# Patient Record
Sex: Female | Born: 1995 | Race: White | Hispanic: No | Marital: Married | Smoking: Former smoker
Health system: Southern US, Community
[De-identification: ages and names within clinical notes are randomized; demographics above are authoritative.]

## PROBLEM LIST (undated history)

## (undated) DIAGNOSIS — O24419 Gestational diabetes mellitus in pregnancy, unspecified control: Secondary | ICD-10-CM

## (undated) DIAGNOSIS — R87629 Unspecified abnormal cytological findings in specimens from vagina: Secondary | ICD-10-CM

## (undated) DIAGNOSIS — F329 Major depressive disorder, single episode, unspecified: Secondary | ICD-10-CM

## (undated) DIAGNOSIS — R519 Headache, unspecified: Secondary | ICD-10-CM

## (undated) DIAGNOSIS — O139 Gestational [pregnancy-induced] hypertension without significant proteinuria, unspecified trimester: Secondary | ICD-10-CM

## (undated) DIAGNOSIS — D649 Anemia, unspecified: Secondary | ICD-10-CM

## (undated) DIAGNOSIS — E119 Type 2 diabetes mellitus without complications: Secondary | ICD-10-CM

## (undated) DIAGNOSIS — F32A Depression, unspecified: Secondary | ICD-10-CM

## (undated) DIAGNOSIS — O1490 Unspecified pre-eclampsia, unspecified trimester: Secondary | ICD-10-CM

## (undated) HISTORY — PX: NO PAST SURGERIES: SHX2092

## (undated) HISTORY — DX: Depression, unspecified: F32.A

## (undated) HISTORY — DX: Major depressive disorder, single episode, unspecified: F32.9

## (undated) HISTORY — DX: Gestational diabetes mellitus in pregnancy, unspecified control: O24.419

---

## 2017-06-20 NOTE — L&D Delivery Note (Signed)
Delivery Note  First Stage: Induction of labor: 04/23/18 @ 9pm with Cytotec, then Intracervical balloon Labor onset: 04/24/18 @ 1745 Augmentation : Pitocin and AROM Analgesia /Anesthesia intrapartum: IV Stadol then epidural  AROM at 1736 for clear fluid   Second Stage: Complete dilation at 2035 Onset of pushing at 2035 FHR second stage Cat 2  Delivery of a healthy, viable female "Achilles Dunk" at 2056 by Carlean Jews, CNM in LOA position with restitution to LOT. Umbilical cord noted at upper fetal chest/neck at time of delivery.   No nuchal cord Cord double clamped after cessation of pulsation, cut by FOB Cord blood sample collected   Third Stage: Placenta delivered via Tomasa Blase intact with 3 VC @ 2104 Placenta disposition: hospital disposal  Uterine tone firm with massage / bleeding brisk immediately after placenta separation, but slowed with massage, IV Pitocin bolus .  There was a continuous trickle and Cytotec was given rectally. Cervix was evaluated for lacerations and a gentle bimanual exam performed to clear clots from lower uterine segment. No cervical lacerations identified. Trickle then resolved.   Small 1st degree perineal laceration identified  Anesthesia for repair: epidural  Repair: 3.0 vicryl repaired in standard fashion  Est. Blood Loss (mL):  Complications: postpartum hemorrhage   Mom to postpartum.  Baby to Couplet care / Skin to Skin.  Newborn: Birth Weight: pending  Apgar Scores: 9, 9 Feeding planned: Breast  Carlean Jews, MSN, CNM [facility]

## 2018-03-15 LAB — OB RESULTS CONSOLE HEPATITIS B SURFACE ANTIGEN: Hepatitis B Surface Ag: NEGATIVE

## 2018-03-15 LAB — OB RESULTS CONSOLE HIV ANTIBODY (ROUTINE TESTING): HIV: NONREACTIVE

## 2018-03-15 LAB — OB RESULTS CONSOLE GC/CHLAMYDIA
Chlamydia: NEGATIVE
GC PROBE AMP, GENITAL: NEGATIVE

## 2018-03-15 LAB — OB RESULTS CONSOLE RUBELLA ANTIBODY, IGM: Rubella: IMMUNE

## 2018-03-15 LAB — OB RESULTS CONSOLE ABO/RH: RH TYPE: POSITIVE

## 2018-03-15 LAB — OB RESULTS CONSOLE ANTIBODY SCREEN: ANTIBODY SCREEN: NEGATIVE

## 2018-03-15 LAB — OB RESULTS CONSOLE RPR: RPR: NONREACTIVE

## 2018-04-02 LAB — OB RESULTS CONSOLE GBS: STREP GROUP B AG: NEGATIVE

## 2018-04-20 ENCOUNTER — Encounter (HOSPITAL_COMMUNITY): Payer: Self-pay | Admitting: *Deleted

## 2018-04-20 ENCOUNTER — Telehealth (HOSPITAL_COMMUNITY): Payer: Self-pay | Admitting: *Deleted

## 2018-04-20 ENCOUNTER — Inpatient Hospital Stay (EMERGENCY_DEPARTMENT_HOSPITAL)
Admission: AD | Admit: 2018-04-20 | Discharge: 2018-04-20 | Disposition: A | Discharge status: Home / Self Care | Payer: BLUE CROSS/BLUE SHIELD | Source: Ambulatory Visit | Attending: Obstetrics and Gynecology | Admitting: Obstetrics and Gynecology

## 2018-04-20 DIAGNOSIS — O24429 Gestational diabetes mellitus in childbirth, unspecified control: Secondary | ICD-10-CM

## 2018-04-20 DIAGNOSIS — Z3A38 38 weeks gestation of pregnancy: Secondary | ICD-10-CM

## 2018-04-20 DIAGNOSIS — Z3483 Encounter for supervision of other normal pregnancy, third trimester: Secondary | ICD-10-CM

## 2018-04-20 DIAGNOSIS — Z9119 Patient's noncompliance with other medical treatment and regimen: Secondary | ICD-10-CM

## 2018-04-20 DIAGNOSIS — D62 Acute posthemorrhagic anemia: Secondary | ICD-10-CM | POA: Diagnosis not present

## 2018-04-20 DIAGNOSIS — Z3689 Encounter for other specified antenatal screening: Secondary | ICD-10-CM

## 2018-04-20 DIAGNOSIS — O9081 Anemia of the puerperium: Secondary | ICD-10-CM | POA: Diagnosis not present

## 2018-04-20 DIAGNOSIS — O26893 Other specified pregnancy related conditions, third trimester: Secondary | ICD-10-CM | POA: Diagnosis present

## 2018-04-20 DIAGNOSIS — Z87891 Personal history of nicotine dependence: Secondary | ICD-10-CM

## 2018-04-20 DIAGNOSIS — O24425 Gestational diabetes mellitus in childbirth, controlled by oral hypoglycemic drugs: Secondary | ICD-10-CM | POA: Diagnosis present

## 2018-04-20 DIAGNOSIS — R160 Hepatomegaly, not elsewhere classified: Secondary | ICD-10-CM | POA: Diagnosis present

## 2018-04-20 NOTE — MAU Note (Signed)
Patient sent over from office.  States the baby had "some dips in the heart rate" and that they wanted her to be monitored.  She reports she has been having some CTX and was 1cm dilated in the office today.

## 2018-04-20 NOTE — MAU Provider Note (Signed)
History     CSN: [id number]  Arrival date and time: 04/20/18 1136   First Provider Initiated Contact with Patient 04/20/18 1248      Chief Complaint  Patient presents with  . Non-stress Test   HPI Amber Daniel is a 22 y.o. G3P1002 at [redacted]w[redacted]d who presents for monitoring from the office. She states she was in the office for an NST due to gestational diabetes and said "the baby was having dips in its heart rate." She denies any vaginal bleeding or leaking of fluid. Reports contractions and was 1cm in the office today. Reports normal fetal movement. She is scheduled for induction on 11/5.  OB History    Gravida  3   Para  2   Term  1   Preterm  0   AB  0   Living  2     SAB  0   TAB  0   Ectopic  0   Multiple  0   Live Births  2           Past Medical History:  Diagnosis Date  . Depression    major in high school    History reviewed. No pertinent surgical history.  Family History  Problem Relation Age of Onset  . Diabetes Father   . Hypertension Father   . Hypertension Paternal Grandfather   . Heart disease Paternal Grandfather     Social History   Tobacco Use  . Smoking status: Former Games developer  . Smokeless tobacco: Never Used  Substance Use Topics  . Alcohol use: Not Currently    Comment: not in pregnancy  . Drug use: Yes    Types: Marijuana    Comment: not since 22 years old    Allergies: No Known Allergies  Medications Prior to Admission  Medication Sig Dispense Refill Last Dose  . EVENING PRIMROSE OIL PO Take 1 capsule by mouth daily.   04/19/2018 at Unknown time  . ferrous sulfate 325 (65 FE) MG tablet Take 325 mg by mouth 2 (two) times daily with a meal.   Past Week at Unknown time  . glyBURIDE (DIABETA) 2.5 MG tablet TK 1 T PO QD WITH DINNER  0 04/19/2018 at Unknown time  . metFORMIN (GLUCOPHAGE) 500 MG tablet TK 1/2 T PO BID AFTER BREAKFAST AND SUPPER  0 04/19/2018 at Unknown time  . Prenatal Vit-Fe Fumarate-FA (PRENATAL  MULTIVITAMIN) TABS tablet Take 1 tablet by mouth daily at 12 noon.   Past Week at Unknown time    Review of Systems  Constitutional: Negative.  Negative for fatigue and fever.  HENT: Negative.   Respiratory: Negative.  Negative for shortness of breath.   Cardiovascular: Negative.  Negative for chest pain.  Gastrointestinal: Negative.  Negative for abdominal pain, constipation, diarrhea, nausea and vomiting.  Genitourinary: Negative.  Negative for dysuria, vaginal bleeding and vaginal discharge.  Neurological: Negative.  Negative for dizziness and headaches.   Physical Exam   Blood pressure 124/77, pulse (!) 103, temperature 98.1 F (36.7 C), temperature source Oral, resp. rate 18, weight 71.7 kg.  Physical Exam  Nursing note and vitals reviewed. Constitutional: She is oriented to person, place, and time. She appears well-developed and well-nourished. No distress.  HENT:  Head: Normocephalic.  Eyes: Pupils are equal, round, and reactive to light.  Cardiovascular: Normal rate, regular rhythm and normal heart sounds.  Respiratory: Effort normal and breath sounds normal. No respiratory distress.  GI: Soft. Bowel sounds are normal. She exhibits no distension.  There is no tenderness.  Neurological: She is alert and oriented to person, place, and time.  Skin: Skin is warm and dry.  Psychiatric: She has a normal mood and affect. Her behavior is normal. Judgment and thought content normal.   Fetal Tracing:  Baseline: 140 Variability: moderate Accels: 15x15 Decels: 1 variable lasting 10 sec  Toco: every 2-8 minutes  MAU Course  Procedures  MDM NST- reactive, one variable  Offered to check cervix due to contraction pattern and patient declined.  Consulted with Dr. Debroah Loop to review FHR tracing- ok to discharge patient home to keep scheduled induction on Tuesday.  Assessment and Plan   1. Non-stress test reactive   2. [redacted] weeks gestation of pregnancy    -Discharge home in  stable condition -Fetal kick count precautions discussed -Patient advised to follow-up with [facility] on Tuesday for induction. -Patient may return to MAU as needed or if her condition were to change or worsen   Rolm Bookbinder CNM 04/20/2018, 12:54 PM

## 2018-04-20 NOTE — Telephone Encounter (Signed)
Preadmission screen  

## 2018-04-20 NOTE — Discharge Instructions (Signed)
Braxton Hicks Contractions °Contractions of the uterus can occur throughout pregnancy, but they are not always a sign that you are in labor. You may have practice contractions called Braxton Hicks contractions. These false labor contractions are sometimes confused with true labor. °What are Braxton Hicks contractions? °Braxton Hicks contractions are tightening movements that occur in the muscles of the uterus before labor. Unlike true labor contractions, these contractions do not result in opening (dilation) and thinning of the cervix. Toward the end of pregnancy (32-34 weeks), Braxton Hicks contractions can happen more often and may become stronger. These contractions are sometimes difficult to tell apart from true labor because they can be very uncomfortable. You should not feel embarrassed if you go to the hospital with false labor. °Sometimes, the only way to tell if you are in true labor is for your health care provider to look for changes in the cervix. The health care provider will do a physical exam and may monitor your contractions. If you are not in true labor, the exam should show that your cervix is not dilating and your water has not broken. °If there are other health problems associated with your pregnancy, it is completely safe for you to be sent home with false labor. You may continue to have Braxton Hicks contractions until you go into true labor. °How to tell the difference between true labor and false labor °True labor °· Contractions last 30-70 seconds. °· Contractions become very regular. °· Discomfort is usually felt in the top of the uterus, and it spreads to the lower abdomen and low back. °· Contractions do not go away with walking. °· Contractions usually become more intense and increase in frequency. °· The cervix dilates and gets thinner. °False labor °· Contractions are usually shorter and not as strong as true labor contractions. °· Contractions are usually irregular. °· Contractions  are often felt in the front of the lower abdomen and in the groin. °· Contractions may go away when you walk around or change positions while lying down. °· Contractions get weaker and are shorter-lasting as time goes on. °· The cervix usually does not dilate or become thin. °Follow these instructions at home: °· Take over-the-counter and prescription medicines only as told by your health care provider. °· Keep up with your usual exercises and follow other instructions from your health care provider. °· Eat and drink lightly if you think you are going into labor. °· If Braxton Hicks contractions are making you uncomfortable: °? Change your position from lying down or resting to walking, or change from walking to resting. °? Sit and rest in a tub of warm water. °? Drink enough fluid to keep your urine pale yellow. Dehydration may cause these contractions. °? Do slow and deep breathing several times an hour. °· Keep all follow-up prenatal visits as told by your health care provider. This is important. °Contact a health care provider if: °· You have a fever. °· You have continuous pain in your abdomen. °Get help right away if: °· Your contractions become stronger, more regular, and closer together. °· You have fluid leaking or gushing from your vagina. °· You pass blood-tinged mucus (bloody show). °· You have bleeding from your vagina. °· You have low back pain that you never had before. °· You feel your baby’s head pushing down and causing pelvic pressure. °· Your baby is not moving inside you as much as it used to. °Summary °· Contractions that occur before labor are called Braxton   Hicks contractions, false labor, or practice contractions. °· Braxton Hicks contractions are usually shorter, weaker, farther apart, and less regular than true labor contractions. True labor contractions usually become progressively stronger and regular and they become more frequent. °· Manage discomfort from Braxton Hicks contractions by  changing position, resting in a warm bath, drinking plenty of water, or practicing deep breathing. °This information is not intended to replace advice given to you by your health care provider. Make sure you discuss any questions you have with your health care provider. °Document Released: 10/20/2016 Document Revised: 10/20/2016 Document Reviewed: 10/20/2016 °Elsevier Interactive Patient Education © 2018 Else[employer] Inc. ° °

## 2018-04-23 ENCOUNTER — Inpatient Hospital Stay (HOSPITAL_COMMUNITY)
Admission: AD | Admit: 2018-04-23 | Discharge: 2018-04-26 | DRG: 806 | Disposition: A | Discharge status: Home / Self Care | Payer: BLUE CROSS/BLUE SHIELD | Source: Ambulatory Visit | Attending: Obstetrics and Gynecology | Admitting: Obstetrics and Gynecology

## 2018-04-23 ENCOUNTER — Encounter (HOSPITAL_COMMUNITY): Payer: Self-pay | Admitting: *Deleted

## 2018-04-23 DIAGNOSIS — Z87891 Personal history of nicotine dependence: Secondary | ICD-10-CM | POA: Diagnosis not present

## 2018-04-23 DIAGNOSIS — Z3490 Encounter for supervision of normal pregnancy, unspecified, unspecified trimester: Secondary | ICD-10-CM | POA: Diagnosis present

## 2018-04-23 DIAGNOSIS — Z3A38 38 weeks gestation of pregnancy: Secondary | ICD-10-CM | POA: Diagnosis not present

## 2018-04-23 DIAGNOSIS — O24425 Gestational diabetes mellitus in childbirth, controlled by oral hypoglycemic drugs: Secondary | ICD-10-CM | POA: Diagnosis present

## 2018-04-23 DIAGNOSIS — O24419 Gestational diabetes mellitus in pregnancy, unspecified control: Secondary | ICD-10-CM

## 2018-04-23 DIAGNOSIS — O26893 Other specified pregnancy related conditions, third trimester: Secondary | ICD-10-CM | POA: Diagnosis present

## 2018-04-23 DIAGNOSIS — Z9119 Patient's noncompliance with other medical treatment and regimen: Secondary | ICD-10-CM | POA: Diagnosis not present

## 2018-04-23 DIAGNOSIS — D62 Acute posthemorrhagic anemia: Secondary | ICD-10-CM | POA: Diagnosis not present

## 2018-04-23 DIAGNOSIS — R160 Hepatomegaly, not elsewhere classified: Secondary | ICD-10-CM | POA: Diagnosis present

## 2018-04-23 DIAGNOSIS — O368390 Maternal care for abnormalities of the fetal heart rate or rhythm, unspecified trimester, not applicable or unspecified: Secondary | ICD-10-CM | POA: Diagnosis present

## 2018-04-23 DIAGNOSIS — O9081 Anemia of the puerperium: Secondary | ICD-10-CM | POA: Diagnosis not present

## 2018-04-23 HISTORY — DX: Anemia, unspecified: D64.9

## 2018-04-23 HISTORY — DX: Type 2 diabetes mellitus without complications: E11.9

## 2018-04-23 LAB — TYPE AND SCREEN
ABO/RH(D): O POS
Antibody Screen: NEGATIVE

## 2018-04-23 LAB — CBC
HCT: 31.9 % — ABNORMAL LOW (ref 36.0–46.0)
Hemoglobin: 10.2 g/dL — ABNORMAL LOW (ref 12.0–15.0)
MCH: 28 pg (ref 26.0–34.0)
MCHC: 32 g/dL (ref 30.0–36.0)
MCV: 87.6 fL (ref 80.0–100.0)
NRBC: 0 % (ref 0.0–0.2)
PLATELETS: 247 10*3/uL (ref 150–400)
RBC: 3.64 MIL/uL — ABNORMAL LOW (ref 3.87–5.11)
RDW: 14.6 % (ref 11.5–15.5)
WBC: 11.4 10*3/uL — ABNORMAL HIGH (ref 4.0–10.5)

## 2018-04-23 LAB — GLUCOSE, CAPILLARY: Glucose-Capillary: 112 mg/dL — ABNORMAL HIGH (ref 70–99)

## 2018-04-23 LAB — ABO/RH: ABO/RH(D): O POS

## 2018-04-23 MED ORDER — LIDOCAINE HCL (PF) 1 % IJ SOLN
30.0000 mL | INTRAMUSCULAR | Status: DC | PRN
  Filled 2018-04-23: qty 30

## 2018-04-23 MED ORDER — SODIUM CHLORIDE 0.9 % IV SOLN: 250.0000 mL | INTRAVENOUS | Status: DC | PRN

## 2018-04-23 MED ORDER — ACETAMINOPHEN 325 MG PO TABS: 650.0000 mg | ORAL_TABLET | ORAL | Status: DC | PRN

## 2018-04-23 MED ORDER — TERBUTALINE SULFATE 1 MG/ML IJ SOLN
0.2500 mg | Freq: Once | INTRAMUSCULAR | Status: DC | PRN
  Filled 2018-04-23: qty 1

## 2018-04-23 MED ORDER — ONDANSETRON HCL 4 MG/2ML IJ SOLN
4.0000 mg | Freq: Four times a day (QID) | INTRAMUSCULAR | Status: DC | PRN
  Administered 2018-04-24: 4 mg via INTRAVENOUS
  Filled 2018-04-23: qty 2

## 2018-04-23 MED ORDER — MISOPROSTOL 50MCG HALF TABLET
50.0000 ug | ORAL_TABLET | ORAL | Status: DC
  Administered 2018-04-23 – 2018-04-24 (×3): 50 ug via BUCCAL
  Filled 2018-04-23 (×3): qty 1

## 2018-04-23 MED ORDER — LACTATED RINGERS IV SOLN: 500.0000 mL | INTRAVENOUS | Status: DC | PRN

## 2018-04-23 MED ORDER — SOD CITRATE-CITRIC ACID 500-334 MG/5ML PO SOLN: 30.0000 mL | ORAL | Status: DC | PRN

## 2018-04-23 MED ORDER — SODIUM CHLORIDE 0.9% FLUSH: 3.0000 mL | INTRAVENOUS | Status: DC | PRN

## 2018-04-23 MED ORDER — OXYTOCIN BOLUS FROM INFUSION
500.0000 mL | Freq: Once | INTRAVENOUS | Status: AC
  Administered 2018-04-24: 500 mL via INTRAVENOUS

## 2018-04-23 MED ORDER — OXYTOCIN 40 UNITS IN LACTATED RINGERS INFUSION - SIMPLE MED
2.5000 [IU]/h | INTRAVENOUS | Status: DC
  Filled 2018-04-23: qty 1000

## 2018-04-23 MED ORDER — SODIUM CHLORIDE 0.9% FLUSH: 3.0000 mL | Freq: Two times a day (BID) | INTRAVENOUS | Status: DC

## 2018-04-23 NOTE — H&P (Signed)
OB ADMISSION/ HISTORY & PHYSICAL:  Admission Date: 04/23/2018  6:41 PM  Admit Diagnosis: Induction of labor at 38+6 weeks for variable decelerations and uncontrolled A2 GDM  Amber Daniel is a 22 y.o. female G3P2 at 38+6 weeks presenting for induction of labor for variable decelerations in the office on NST and uncontrolled A2 gestational diabetes. She had 2 variable decelerations on NST at the office today- lasting 1 minute to nadir 100bpm.  Her BPP was 8/8. She also has uncontrolled A2 gestational diabetes and is non-compliant with taking Metformin 500mg  BID and Glyburide 2.5mg .  She states she last took her medication on Friday and did not check blood sugars regularly over the weekend.   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 Of Benign liver mass - needs to see GI PP  - Yeast vaginitis  - Iron deficiency anemia  - Major Depression in high school; not on meds   Prenatal Labs: ABO, Rh: --/--/O POS (11/04 1913) Antibody: NEG (11/04 1913) Rubella: Immune (09/26 0000)  RPR: Nonreactive (09/26 0000)  HBsAg: Negative (09/26 0000)  HIV: Non-reactive (09/26 0000)  GBS: Negative (10/14 0000)  Declined genetic screening  CUS - normal female anatomy, incomplete views of heart d/t advanced gestational age, suboptimal views of placental cord insertion Declines Flu and Tdap   Medical / Surgical History :  Past medical history:  Past Medical History:   Diagnosis Date  . Anemia   . Depression    major in high school  . Diabetes mellitus without complication [facility])      Past surgical history: History reviewed. No pertinent surgical history.  Family History:  Family History  Problem Relation Age of Onset  . Diabetes Father   . Hypertension Father   . Hypertension Paternal Grandfather   . Heart disease Paternal Grandfather      Social History:  reports that she has quit smoking. She has never used smokeless tobacco. She reports that she drank alcohol. She reports that she has current or past drug history. Drug: Marijuana.   Allergies: Patient has no known allergies.    Current Medications at time of admission:  Prior to Admission medications   Medication Sig Start Date End Date Taking? Authorizing Provider  ferrous sulfate 325 (65 FE) MG tablet Take 325 mg by mouth 2 (two) times daily with a meal.   Yes [provider]  glyBURIDE (DIABETA) 2.5 MG tablet TK 1 T PO QD WITH DINNER 04/16/18  Yes [provider]  metFORMIN (GLUCOPHAGE) 500 MG tablet TK 1/2 T PO BID AFTER BREAKFAST AND SUPPER 04/11/18  Yes [provider]  Prenatal Vit-Fe Fumarate-FA (PRENATAL MULTIVITAMIN) TABS tablet Take 1 tablet by mouth daily at 12 noon.   Yes [provider]  EVENING PRIMROSE OIL PO Take 1 capsule by mouth daily.    [provider]     Review of Systems: Active FM Irregular painful contractions for several days  No LOF  / SROM  No bloody show   Physical Exam:  VS: Blood pressure 116/68, pulse (!) 105, temperature 98.1 F (36.7 C), temperature source Oral, resp. rate 16.  General: alert and oriented, appears calm Heart: RRR Lungs: Clear lung fields Abdomen: Gravid, soft and non-tender, non-distended / uterus: gravid, non-tender Extremities: no edema  Genitalia / VE: Dilation: Fingertip Effacement (%): 50 Exam by::  , CNM  FHR: baseline rate 140 bpm / variability  moderate / accelerations + 15x15 / no decelerations TOCO: irregular   Assessment: 38+[redacted] weeks gestation Induction stage of labor FHR category 1 Variable decelerations in office, now resolved Uncontrolled, non-compliant A2 GDM  Hx. Of PPH   Plan:  1. Admit to [employer]   - Routine labor and delivery orders   - Pain management: nitrous oxide; epidural PRN   - Saline lock PIV    - Light laboring gestational carb modified diet    - Induction method: Cytotec buccal every 4 hours    - Continuous fetal monitoring   - Check CBGs every 4 hours  2. GBS Negative 3. Postpartum:   - Breast feeding 4. Anticipate MOD: NSVD   - Proven pelvis: 8#1oz   Dr. Billy Coast notified of admission / plan of care  Carlean Jews, MSN, [facility]

## 2018-04-23 NOTE — Anesthesia Pain Management Evaluation Note (Signed)
  CRNA Pain Management Visit Note  Patient: Amber Daniel, 22 y.o., female  "Hello I am a member of the anesthesia team at [facility]. We have an anesthesia team available at all times to provide care throughout the hospital, including epidural management and anesthesia for C-section. I don't know your plan for the delivery whether it a natural birth, water birth, IV sedation, nitrous supplementation, doula or epidural, but we want to meet your pain goals."   1.Was your pain managed to your expectations on prior hospitalizations?   Yes   2.What is your expectation for pain management during this hospitalization?     Labor support without medications, Epidural and IV pain meds  3.How can we help you reach that goal? flexible  Record the patient's initial score and the patient's pain goal.   Pain: 4  Pain Goal: 6 [facility] wants you to be able to say your pain was always managed very well.  Deja Kaigler 04/23/2018

## 2018-04-24 ENCOUNTER — Encounter (HOSPITAL_COMMUNITY): Payer: Self-pay

## 2018-04-24 ENCOUNTER — Inpatient Hospital Stay (HOSPITAL_COMMUNITY): Payer: BLUE CROSS/BLUE SHIELD | Admitting: Anesthesiology

## 2018-04-24 DIAGNOSIS — O24419 Gestational diabetes mellitus in pregnancy, unspecified control: Secondary | ICD-10-CM

## 2018-04-24 DIAGNOSIS — Z3490 Encounter for supervision of normal pregnancy, unspecified, unspecified trimester: Secondary | ICD-10-CM | POA: Diagnosis present

## 2018-04-24 LAB — GLUCOSE, CAPILLARY
GLUCOSE-CAPILLARY: 81 mg/dL (ref 70–99)
GLUCOSE-CAPILLARY: 90 mg/dL (ref 70–99)
GLUCOSE-CAPILLARY: 80 mg/dL (ref 70–99)
Glucose-Capillary: 83 mg/dL (ref 70–99)
Glucose-Capillary: 74 mg/dL (ref 70–99)

## 2018-04-24 LAB — RPR: RPR Ser Ql: NONREACTIVE

## 2018-04-24 MED ORDER — IBUPROFEN 600 MG PO TABS
600.0000 mg | ORAL_TABLET | Freq: Four times a day (QID) | ORAL | Status: DC
  Administered 2018-04-24 – 2018-04-26 (×6): 600 mg via ORAL
  Filled 2018-04-24 (×6): qty 1

## 2018-04-24 MED ORDER — MISOPROSTOL 200 MCG PO TABS
ORAL_TABLET | ORAL | Status: AC
  Administered 2018-04-24: 800 ug
  Filled 2018-04-24: qty 4

## 2018-04-24 MED ORDER — DIPHENHYDRAMINE HCL 50 MG/ML IJ SOLN: 12.5000 mg | INTRAMUSCULAR | Status: DC | PRN

## 2018-04-24 MED ORDER — TERBUTALINE SULFATE 1 MG/ML IJ SOLN
0.2500 mg | Freq: Once | INTRAMUSCULAR | Status: DC | PRN
  Filled 2018-04-24: qty 1

## 2018-04-24 MED ORDER — DIPHENHYDRAMINE HCL 25 MG PO CAPS: 25.0000 mg | ORAL_CAPSULE | Freq: Four times a day (QID) | ORAL | Status: DC | PRN

## 2018-04-24 MED ORDER — BUTORPHANOL TARTRATE 1 MG/ML IJ SOLN
1.0000 mg | INTRAMUSCULAR | Status: DC | PRN
  Administered 2018-04-24 (×4): 1 mg via INTRAVENOUS
  Filled 2018-04-24 (×4): qty 1

## 2018-04-24 MED ORDER — COCONUT OIL OIL: 1.0000 "application " | TOPICAL_OIL | Status: DC | PRN

## 2018-04-24 MED ORDER — ACETAMINOPHEN 325 MG PO TABS
650.0000 mg | ORAL_TABLET | ORAL | Status: DC | PRN
  Administered 2018-04-25 – 2018-04-26 (×5): 650 mg via ORAL
  Filled 2018-04-24 (×5): qty 2

## 2018-04-24 MED ORDER — FERROUS SULFATE 325 (65 FE) MG PO TABS
325.0000 mg | ORAL_TABLET | Freq: Two times a day (BID) | ORAL | Status: DC
  Administered 2018-04-25 – 2018-04-26 (×3): 325 mg via ORAL
  Filled 2018-04-24 (×3): qty 1

## 2018-04-24 MED ORDER — WITCH HAZEL-GLYCERIN EX PADS
1.0000 "application " | MEDICATED_PAD | CUTANEOUS | Status: DC | PRN
  Administered 2018-04-25: 1 via TOPICAL

## 2018-04-24 MED ORDER — BENZOCAINE-MENTHOL 20-0.5 % EX AERO
1.0000 "application " | INHALATION_SPRAY | CUTANEOUS | Status: DC | PRN
  Filled 2018-04-24: qty 56

## 2018-04-24 MED ORDER — DIBUCAINE 1 % RE OINT: 1.0000 "application " | TOPICAL_OINTMENT | RECTAL | Status: DC | PRN

## 2018-04-24 MED ORDER — SIMETHICONE 80 MG PO CHEW: 80.0000 mg | CHEWABLE_TABLET | ORAL | Status: DC | PRN

## 2018-04-24 MED ORDER — OXYCODONE HCL 5 MG PO TABS: 10.0000 mg | ORAL_TABLET | ORAL | Status: DC | PRN

## 2018-04-24 MED ORDER — SENNOSIDES-DOCUSATE SODIUM 8.6-50 MG PO TABS
2.0000 | ORAL_TABLET | ORAL | Status: DC
  Administered 2018-04-24 – 2018-04-25 (×2): 2 via ORAL
  Filled 2018-04-24 (×2): qty 2

## 2018-04-24 MED ORDER — LIDOCAINE-EPINEPHRINE (PF) 2 %-1:200000 IJ SOLN
INTRAMUSCULAR | Status: DC | PRN
  Administered 2018-04-24: 4 mL via EPIDURAL
  Administered 2018-04-24: 3 mL via EPIDURAL

## 2018-04-24 MED ORDER — EPHEDRINE 5 MG/ML INJ
10.0000 mg | INTRAVENOUS | Status: DC | PRN
  Filled 2018-04-24: qty 2

## 2018-04-24 MED ORDER — OXYTOCIN 40 UNITS IN LACTATED RINGERS INFUSION - SIMPLE MED
1.0000 m[IU]/min | INTRAVENOUS | Status: DC
  Administered 2018-04-24: 2 m[IU]/min via INTRAVENOUS

## 2018-04-24 MED ORDER — LACTATED RINGERS IV SOLN: 500.0000 mL | Freq: Once | INTRAVENOUS | Status: DC

## 2018-04-24 MED ORDER — ONDANSETRON HCL 4 MG PO TABS: 4.0000 mg | ORAL_TABLET | ORAL | Status: DC | PRN

## 2018-04-24 MED ORDER — PHENYLEPHRINE 40 MCG/ML (10ML) SYRINGE FOR IV PUSH (FOR BLOOD PRESSURE SUPPORT)
80.0000 ug | PREFILLED_SYRINGE | INTRAVENOUS | Status: DC | PRN
  Filled 2018-04-24: qty 5
  Filled 2018-04-24: qty 10

## 2018-04-24 MED ORDER — TETANUS-DIPHTH-ACELL PERTUSSIS 5-2.5-18.5 LF-MCG/0.5 IM SUSP: 0.5000 mL | Freq: Once | INTRAMUSCULAR | Status: DC

## 2018-04-24 MED ORDER — PHENYLEPHRINE 40 MCG/ML (10ML) SYRINGE FOR IV PUSH (FOR BLOOD PRESSURE SUPPORT)
80.0000 ug | PREFILLED_SYRINGE | INTRAVENOUS | Status: DC | PRN
  Filled 2018-04-24: qty 5

## 2018-04-24 MED ORDER — PRENATAL MULTIVITAMIN CH
1.0000 | ORAL_TABLET | Freq: Every day | ORAL | Status: DC
  Administered 2018-04-25: 1 via ORAL
  Filled 2018-04-24: qty 1

## 2018-04-24 MED ORDER — ZOLPIDEM TARTRATE 5 MG PO TABS: 5.0000 mg | ORAL_TABLET | Freq: Every evening | ORAL | Status: DC | PRN

## 2018-04-24 MED ORDER — OXYCODONE HCL 5 MG PO TABS: 5.0000 mg | ORAL_TABLET | ORAL | Status: DC | PRN

## 2018-04-24 MED ORDER — FENTANYL 2.5 MCG/ML BUPIVACAINE 1/10 % EPIDURAL INFUSION (WH - ANES)
14.0000 mL/h | INTRAMUSCULAR | Status: DC | PRN
  Administered 2018-04-24: 14 mL/h via EPIDURAL
  Filled 2018-04-24: qty 100

## 2018-04-24 MED ORDER — ONDANSETRON HCL 4 MG/2ML IJ SOLN: 4.0000 mg | INTRAMUSCULAR | Status: DC | PRN

## 2018-04-24 NOTE — Progress Notes (Signed)
S:  Feeling much better after IV stadol; contractions have spaced out. Discussed placing intracervical balloon and starting low dose Pitocin. Pt. Agrees to intracervical balloon, but wants to wait on Pitocin to discuss with her husband  O:  VS: Blood pressure 119/73, pulse 73, temperature 98 F (36.7 C), temperature source Oral, resp. rate 18.        FHR : baseline 140 bpm / variability moderate / accelerations + / no decelerations        Toco: contractions every 3 minutes / mild-moderate        Cervix : Dilation: 1.5 Effacement (%): 70 Cervical Position: Middle Station: -2 Presentation: Vertex Exam by:: M Tatsuo Musial CNM        Membranes: Intact   A: Variable decelerations in office, now resolved     Uncontrolled, non-compliant A2 GDM       Hx. Of PPH    Latent labor     FHR category 1    GBS Negative   P: Intracervical balloon placed      Recommended low-dose Pitocin; pt. Declines at this time and wants to discuss with husband    Stadol 1mg  IV every 1 hr PRN for moderate pain on scale >4     Will reassess in 1 hr      Anticipate NSVD    Carlean Jews, MSN, CNM [facility]

## 2018-04-24 NOTE — Progress Notes (Addendum)
S:  Pt. Coping well through contractions. On 8 milliunits of Pitocin. Using Stadol IVP PRN.   O:  VS: Blood pressure 115/61, pulse 79, temperature 97.9 F (36.6 C), temperature source Oral, resp. rate 18.        FHR : baseline 130 bpm/ variability moderate / accelerations + / no decelerations        Toco: contractions every 2-4.5 minutes / mild-moderate         Cervix : 5cm/70%/-2 - membrane sweep performed        Membranes: Intact, BBOW  A: IOL forVariable decelerations in office, now resolved Uncontrolled, non-compliant A2 GDM - CBGs stable Hx. Of PPH    Latentlabor FHR category 1 GBS Negative  P: Continue Pitocin augmentation       Discussed AROM with patient and husband - she is concerned to feel intense contraction pain       Desires epidural prior to AROM - requested      Will AROM when comfortable       Reassess in 1-2 hours     Anticipate NSVD     Carlean Jews, MSN, CNM [facility]

## 2018-04-24 NOTE — Progress Notes (Signed)
Late entry note:  S:  Pt. More comfortable with epidural. Feels some contractions on right side. Discussed AROM and pt and husband agree.   O:  VS: Blood pressure 113/65, pulse 77, temperature 97.8 F (36.6 C), temperature source Oral, resp. rate 18.        FHR : baseline 130 bpm / variability moderate / accelerations + / no decelerations        Toco: contractions every 1-4 minutes / moderate        Cervix : Dilation: 5.5 Effacement (%): 70 Cervical Position: Middle Station: -1 Presentation: Vertex Exam by:: M. Giana Castner, CNM         Membranes: AROM for moderate clear fluid  A: IOL forVariable decelerations in office, now resolved Uncontrolled, non-compliant A2 GDM - CBGs stable Hx. Of PPH    Latentlabor FHR category 1 GBS Negative  P: Continue Pitocin augmentation      Frequent position changes       Reassess in 1-2 hours      Anticipate NSVD    Carlean Jews, MSN, CNM [facility]

## 2018-04-24 NOTE — Anesthesia Preprocedure Evaluation (Signed)
Anesthesia Evaluation  Patient identified by MRN, date of birth, ID band Patient awake    Reviewed: Allergy & Precautions, NPO status , Patient's Chart, lab work & pertinent test results  Airway Mallampati: I  TM Distance: >3 FB Neck ROM: Full    Dental no notable dental hx. (+) Teeth Intact   Pulmonary neg pulmonary ROS, former smoker,    Pulmonary exam normal breath sounds clear to auscultation       Cardiovascular negative cardio ROS Normal cardiovascular exam Rhythm:Regular Rate:Normal     Neuro/Psych PSYCHIATRIC DISORDERS Depression negative neurological ROS     GI/Hepatic negative GI ROS, Neg liver ROS,   Endo/Other  negative endocrine ROSdiabetes, Poorly Controlled, Gestational  Renal/GU negative Renal ROS  negative genitourinary   Musculoskeletal negative musculoskeletal ROS (+)   Abdominal   Peds negative pediatric ROS (+)  Hematology  (+) Blood dyscrasia, anemia ,   Anesthesia Other Findings   Reproductive/Obstetrics (+) Pregnancy                             Anesthesia Physical Anesthesia Plan  ASA: III  Anesthesia Plan: Epidural   Post-op Pain Management:    Induction:   PONV Risk Score and Plan: Treatment may vary due to age or medical condition  Airway Management Planned: Natural Airway  Additional Equipment:   Intra-op Plan:   Post-operative Plan:   Informed Consent: I have reviewed the patients History and Physical, chart, labs and discussed the procedure including the risks, benefits and alternatives for the proposed anesthesia with the patient or authorized representative who has indicated his/her understanding and acceptance.     Plan Discussed with: Anesthesiologist  Anesthesia Plan Comments: (Patient identified. Risks, benefits, options discussed with patient including but not limited to bleeding, infection, nerve damage, paralysis, failed block,  incomplete pain control, headache, blood pressure changes, nausea, vomiting, reactions to medication, itching, and post partum back pain. Confirmed with bedside nurse the patient's most recent platelet count. Confirmed with the patient that they are not taking any anticoagulation, have any bleeding history or any family history of bleeding disorders. Patient expressed understanding and wishes to proceed. All questions were answered. )        Anesthesia Quick Evaluation

## 2018-04-24 NOTE — Progress Notes (Addendum)
S:  Pt. Doing well with intracervical balloon. Contractions have spaced out. Discussed starting Pitocin with pt. And husband and they agree. S/p Stadol 1mg  IVP x 2 doses  O:  VS: Blood pressure (!) 116/57, pulse 74, temperature 97.9 F (36.6 C), temperature source Oral, resp. rate 16.        FHR : baseline 120 bpm / variability minimal to moderate due to Stadol / accelerations + / no decelerations        Toco: contractions every 1.5-6 minutes / mild /        Cervix : deferred        Membranes: intact  A: IOL for Variable decelerations in office, now resolved Uncontrolled, non-compliant A2 GDM - CBGs stable  Hx. Of PPH Latentlabor FHR category 1 GBS Negative    P: Begin Pitocin at 2 milliunits and increase by 2 milliunits per protocol      Begin clear liquid diet now     Reassess in 1-2 hours     Epidural upon request      Traction to foley bulb every hour     Anticipate NSVD    Carlean Jews, MSN, CNM [facility]

## 2018-04-24 NOTE — Progress Notes (Signed)
S:  Intracervical balloon out at 1345. On Pitocin at 6 milliunits. Has been using Stadol IVP PRN for pain relief.   O:  VS: Blood pressure 124/76, pulse 69, temperature 97.9 F (36.6 C), temperature source Oral, resp. rate 18.        FHR : baseline 125 bpm / variability moderate / accelerations + / no decelerations        Toco: contractions every 2-4.5 minutes / mild         Cervix : Dilation: 4 Effacement (%): 70 Cervical Position: Middle Station: -2 Presentation: Vertex Exam by:: Foye Clock RN        Membranes: Intact  A: IOL for Variable decelerations in office, now resolved Uncontrolled, non-compliant A2 GDM - CBGs stable  Hx. Of PPH Latentlabor FHR category 1 GBS Negative  P: Continue with Pitocin augmentation       Amniotomy when able      Epidural upon request     Reassess in 1-2 hours    Anticipate NSVD    Carlean Jews, MSN, CNM [facility]

## 2018-04-24 NOTE — Progress Notes (Signed)
S:  S/p Cytotec buccal x 3. Reports some sleep over night.  Reports contractions are getting very intense, breathing well through them.  Requesting to try nitrous oxide or IV pain medication.   O:  VS: Blood pressure 119/73, pulse 73, temperature 98 F (36.7 C), temperature source Oral, resp. rate 18.        FHR : baseline 140 bpm / variability moderate / accelerations +15x15 / no decelerations        Toco: contractions every 1.5-3  minutes / moderate         Cervix : 1.5cm/70%/-2/vtx/middle position         Membranes: intact  A: Variable decelerations in office, now resolved     Uncontrolled, non-compliant A2 GDM       Hx. Of PPH    Latent labor     FHR category 1    GBS Negative   P: Discussed intracervical balloon placement     Pt. Requests IV pain medication then reassess in 1 hour for placement; adequate contraction pattern at this point     Stadol 1mg  IV every 1 hr PRN for moderate pain on scale >4     Reassess in 1-2 hrs     CBGs well controlled      Anticipate NSVD    Carlean Jews, MSN, CNM [facility]

## 2018-04-24 NOTE — Anesthesia Procedure Notes (Signed)
Epidural Patient location during procedure: OB Start time: 04/24/2018 5:05 PM End time: 04/24/2018 5:20 PM  Staffing Anesthesiologist: Elmer Picker, MD Performed: anesthesiologist   Preanesthetic Checklist Completed: patient identified, pre-op evaluation, timeout performed, IV checked, risks and benefits discussed and monitors and equipment checked  Epidural Patient position: sitting Prep: site prepped and draped and DuraPrep Patient monitoring: continuous pulse ox, blood pressure, heart rate and cardiac monitor Approach: midline Location: L3-L4 Injection technique: LOR air  Needle:  Needle type: Tuohy  Needle gauge: 17 G Needle length: 9 cm Needle insertion depth: 4 cm Catheter type: closed end flexible Catheter size: 19 Gauge Catheter at skin depth: 9 cm Test dose: negative  Assessment Sensory level: T8 Events: blood not aspirated, injection not painful, no injection resistance, negative IV test and no paresthesia  Additional Notes Patient identified. Risks/Benefits/Options discussed with patient including but not limited to bleeding, infection, nerve damage, paralysis, failed block, incomplete pain control, headache, blood pressure changes, nausea, vomiting, reactions to medication both or allergic, itching and postpartum back pain. Confirmed with bedside nurse the patient's most recent platelet count. Confirmed with patient that they are not currently taking any anticoagulation, have any bleeding history or any family history of bleeding disorders. Patient expressed understanding and wished to proceed. All questions were answered. Sterile technique was used throughout the entire procedure. Please see nursing notes for vital signs. Test dose was given through epidural catheter and negative prior to continuing to dose epidural or start infusion. Warning signs of high block given to the patient including shortness of breath, tingling/numbness in hands, complete motor block, or  any concerning symptoms with instructions to call for help. Patient was given instructions on fall risk and not to get out of bed. All questions and concerns addressed with instructions to call with any issues or inadequate analgesia.  Reason for block:procedure for pain

## 2018-04-25 ENCOUNTER — Other Ambulatory Visit: Payer: Self-pay

## 2018-04-25 ENCOUNTER — Inpatient Hospital Stay (HOSPITAL_COMMUNITY)
Admission: RE | Admit: 2018-04-25 | Discharge: 2018-04-25 | Disposition: A | Discharge status: Home / Self Care | Payer: BLUE CROSS/BLUE SHIELD | Source: Ambulatory Visit | Attending: Obstetrics and Gynecology | Admitting: Obstetrics and Gynecology

## 2018-04-25 LAB — CBC
HEMATOCRIT: 25.9 % — AB (ref 36.0–46.0)
Hemoglobin: 8.4 g/dL — ABNORMAL LOW (ref 12.0–15.0)
MCH: 27.9 pg (ref 26.0–34.0)
MCHC: 32.4 g/dL (ref 30.0–36.0)
MCV: 86 fL (ref 80.0–100.0)
Platelets: 161 10*3/uL (ref 150–400)
RBC: 3.01 MIL/uL — AB (ref 3.87–5.11)
RDW: 14.6 % (ref 11.5–15.5)
WBC: 14.9 10*3/uL — ABNORMAL HIGH (ref 4.0–10.5)
nRBC: 0 % (ref 0.0–0.2)

## 2018-04-25 NOTE — Progress Notes (Signed)
CLINICAL SOCIAL WORK MATERNAL/CHILD NOTE  Patient Details  Name: Girl Pamella Samons MRN: [id number] Date of Birth: 08-25-2019  Date:  03-21-18  Clinical Social Worker Initiating Note:  Kingsley Spittle LCSW  Date/Time: Initiated:  04/25/18/1400     Child's Name:  Shirlee Limerick    Biological Parents:  Mother, Father   Need for Interpreter:  None   Reason for Referral:  Late or No Prenatal Care    Address:  [place] [address]    Phone number:  [phone] (home)     Additional phone number: N/A  Household Members/Support Persons (HM/SP):   Household Member/Support Person 1, Household Member/Support Person 2, Household Member/Support Person 3   HM/SP Name Relationship DOB or Age  HM/SP -1 Lequita Asal  FOB    HM/SP -2   Daughter 24 years old   HM/SP -3   Daughter  50 months   HM/SP -4        HM/SP -5        HM/SP -6        HM/SP -7        HM/SP -8          Natural Supports (not living in the home):  Extended Family   Professional Supports:     Employment: Unemployed   Type of Work:     Education:      Homebound arranged:    Museum/gallery curator Resources:  Multimedia programmer   Other Resources:      Cultural/Religious Considerations Which May Impact Care:  N/A  Strengths:  Ability to meet basic needs , Compliance with medical plan , Home prepared for child    Psychotropic Medications:         Pediatrician:       Pediatrician List:   [place]      Pediatrician Fax Number:    Risk Factors/Current Problems:  None   Cognitive State:  Alert    Mood/Affect:  Happy , Relaxed , Comfortable    CSW Assessment: CSW met with MOB via bedside to provide any supports needed and inform them pf monitoring CDS due to late [facility]. MOB allowed FOB to stay in room during conversation. Babies name is Rayna. MOB and FOB recently moved from [facility] to [place] to find  new church home. MOB/ FOB are currently living together with their other 2 children; ages 24 and 55 months. MOB states she received late [place] due to moving from [facility] to [facility]- MOB voiced this being a stressful time for her and her family. MOB is now being seen at [facility] and plans to continue following up with them. MOB does not have a pediatrician picked out at this time. MOB has some supports in [place] but MOB primarily relies on FOB for support. MOB is currently not working and watches the children at home. FOB is the primary financial support for the house hold and does contract work- FOB currently does not have any insurance.   CSW informed MOB of UDS being negative and monitoring of CDS/ CPS report being completed in the event of a positive CDS. MOB voiced understanding and had no concerns at this time.    CSW Plan/Description:  No Further Intervention Required/No Barriers to Discharge, CSW Will Continue to Monitor Umbilical Cord Tissue Drug Screen Results and Make Report if Warranted  Weston Anna, LCSW 04-Mar-2018, 3:21 PM

## 2018-04-25 NOTE — Lactation Note (Signed)
This note was copied from a baby's chart. Lactation Consultation Note  Patient Name: Amber Daniel [id number]'V Date: 04/25/2018 Reason for consult: Initial assessment;Term   Initial consult with exp BF mom of 28 hour old infant. Infant with 5 BF for 10-35 minutes, 5 BF attempts, 2 voids and 2 stools since birth. LATCH Scores 9-10. Infant weight 7 pounds 3.2 ounces with 2% weight loss since birth.   Mom reports infant is feeding well with some feeding where she is sleepy. Enc mom to feed STS and to stimulate infant as needed to maintain active suckling. Enc mom to massage breast with feedings to maximize milk transfer. Mom reports she can hear infant swallowing at the breast. Mom is able to hand express colostrum, enc mom to hand express and feed any colostrum obtained to infant with finger or spoon. Mom is noticing some nipple pain with feedings, she reports it resolves quickly. Enc mom to support infant well with feeding and to use the cross cradle or football holds for feedings.   Mom has a PIS at home, she requested a DEBP kit for home use. Pump kit given as requested. Let mom know that she can also obtain parts at [facility] or [employer].   Reviewed BF Basics, pillow and head support, hand expression, cluster feeding and spoon feeding. Enc mom to call out for feeding assistance as needed. Mom's questions answered about feeding.   BF Resources handout and [facility] brochure given, mom informed of IP/OP Services, BF Support Groups and [place] phone #.    Maternal Data Formula Feeding for Exclusion: No Has patient been taught Hand Expression?: Yes Does the patient have breastfeeding experience prior to this delivery?: Yes  Feeding Feeding Type: Breast Fed  LATCH Score Latch: Too sleepy or reluctant, no latch achieved, no sucking elicited.                 Interventions Interventions: Breast feeding basics reviewed;Support pillows;Position options;Skin to skin;Breast compression;Hand  express;Breast massage  Lactation Tools Discussed/Used WIC Program: No   Consult Status Consult Status: Follow-up Date: 04/26/18 Follow-up type: In-patient    [name] Amber Daniel 04/25/2018, 1:29 PM

## 2018-04-25 NOTE — Anesthesia Postprocedure Evaluation (Signed)
Anesthesia Post Note  Patient: Amber Daniel  Procedure(s) Performed: AN AD HOC LABOR EPIDURAL     Patient location during evaluation: Mother Baby Anesthesia Type: Epidural Level of consciousness: awake and alert Pain management: pain level controlled Vital Signs Assessment: post-procedure vital signs reviewed and stable Respiratory status: spontaneous breathing, nonlabored ventilation and respiratory function stable Cardiovascular status: stable Postop Assessment: no headache, no backache and epidural receding Anesthetic complications: no    Last Vitals:  Vitals:   04/25/18 0020 04/25/18 0442  BP: 127/67 (!) 108/55  Pulse: 77 75  Resp:    Temp: 36.9 C 36.9 C  SpO2: 100% 99%    Last Pain:  Vitals:   04/25/18 0503  TempSrc:   PainSc: 4    Pain Goal:                 Junious Silk

## 2018-04-25 NOTE — Progress Notes (Signed)
PPD #1, SVD, 1st degree repair, s/p PPH, baby girl "Achilles Dunk"  S:  Reports feeling good, minimal discomfort, reports some occasional rectal pressure             Tolerating po/ No nausea or vomiting / Denies dizziness or SOB             Bleeding is light             Pain controlled with Motrin             Up ad lib / ambulatory / voiding QS  Newborn breast feeding - reports latching is going well   O:               VS: BP 121/74 (BP Location: Left Arm)   Pulse 67   Temp 98.1 F (36.7 C) (Oral)   Resp 18   Ht 5\' 3"  (1.6 m)   Wt 71.7 kg   SpO2 99%   Breastfeeding? Unknown   BMI 28.00 kg/m    LABS:              Recent Labs    04/23/18 1900 04/25/18 0614  WBC 11.4* 14.9*  HGB 10.2* 8.4*  PLT 247 161               Blood type: --/--/O POS, O POS Performed at [facility], [address], [place], [place] [address]  [phone] 091511/04 1913)  Rubella: Immune (09/26 0000)                     I&O: Intake/Output      11/05 0701 - 11/06 0700 11/06 0701 - 11/07 0700   Urine (mL/kg/hr) 275    Blood 875    Total Output 1150    Net -1150          Declines flu and tdap               Physical Exam:             Alert and oriented X3  Lungs: Clear and unlabored  Heart: regular rate and rhythm / no murmurs  Abdomen: soft, non-tender, non-distended              Fundus: firm, non-tender, U-1  Perineum: well approximated 1st degree laceration; mild edema, mild ecchymosis; no erythema   Lochia: small, no clots   Extremities: no edema, no calf pain or tenderness    A: PPD # 1, SVD  1st degree repair   A2GDM - delivered    - Needs f/u PP   S/p PPH with ABL anemia compounding chronic IDA   - on ferrous sulfate 325mg  PO BID   - asymptomatic   Hx. Of Depression    - watch for PPD   Hx. Of benign liver mass    - needs to f/u with GI PP   Doing well - stable status  Routine post partum orders  Abdominal binder   See lactation  May shower today  Anticipate discharge home tomorrow    Carlean Jews, MSN, CNM [facility]

## 2018-04-26 MED ORDER — IBUPROFEN 600 MG PO TABS: 600.0000 mg | ORAL_TABLET | Freq: Four times a day (QID) | ORAL | 0 refills | Status: DC

## 2018-04-26 NOTE — Discharge Summary (Signed)
OB Discharge Summary  Patient Name: Amber Daniel DOB: 16-Jul-1995 MRN: [id number]  Date of admission: 04/23/2018 Delivering MD: Carlean Jews C   Date of discharge: 04/26/2018  Admitting diagnosis: INDUCTION Intrauterine pregnancy: [redacted]w[redacted]d     Secondary diagnosis:Principal Problem:   Postpartum care following vaginal delivery (11/5) Active Problems:   Variable fetal heart rate decelerations, antepartum   GDM, class A2   Encounter for planned induction of labor   SVD (spontaneous vaginal delivery)   Postpartum hemorrhage   First degree perineal laceration during delivery  Additional problems: Gestational diabetes     Discharge diagnosis: Term Pregnancy Delivered, GDM A2 and PPH                                                                     Post partum procedures:None  Augmentation: AROM, Pitocin, Cytotec and Foley Balloon  Complications: Hemorrhage>1068mL  Hospital course:  Induction of Labor With Vaginal Delivery   22 y.o. yo G3P2003 at [redacted]w[redacted]d was admitted to the hospital 04/23/2018 for induction of labor.  Indication for induction: A2 DM.  Patient had an uncomplicated labor course as follows: Membrane Rupture Time/Date: 5:36 PM ,04/24/2018   Intrapartum Procedures: Episiotomy: None [1]                                         Lacerations:  1st degree [2];Perineal [11]  Patient had delivery of a Viable infant.  Information for the patient's newborn:  Daesia, Zylka [[id number]]  Delivery Method: Vaginal, Spontaneous(Filed from Delivery Summary)   04/24/2018  Details of delivery can be found in separate delivery note.  Patient had a routine postpartum course. Patient is discharged home 04/26/18.  Physical exam  Vitals:   04/25/18 0814 04/25/18 1507 04/25/18 2222 04/26/18 0500  BP:  130/73 116/68 112/67  Pulse:  77 78 (!) 59  Resp:  17 18   Temp:  98.1 F (36.7 C) 98.2 F (36.8 C) 97.8 F (36.6 C)  TempSrc:  Oral Oral   SpO2:    100%  Weight: 71.7  kg     Height: 5\' 3"  (1.6 m)      General: alert Lochia: appropriate Uterine Fundus: firm Incision: N/A DVT Evaluation: No evidence of DVT seen on physical exam. Labs: Lab Results  Component Value Date   WBC 14.9 (H) 04/25/2018   HGB 8.4 (L) 04/25/2018   HCT 25.9 (L) 04/25/2018   MCV 86.0 04/25/2018   PLT 161 04/25/2018   No flowsheet data found.  Discharge instruction: per After Visit Summary and "Baby and Me Booklet".  After Visit Meds:  Allergies as of 04/26/2018   No Known Allergies     Medication List    STOP taking these medications   glyBURIDE 2.5 MG tablet Commonly known as:  DIABETA   metFORMIN 500 MG tablet Commonly known as:  GLUCOPHAGE     TAKE these medications   ferrous sulfate 325 (65 FE) MG tablet Take 325 mg by mouth 2 (two) times daily with a meal.   ibuprofen 600 MG tablet Commonly known as:  ADVIL,MOTRIN Take 1 tablet (600 mg total) by mouth every  6 (six) hours.   prenatal multivitamin Tabs tablet Take 2 tablets by mouth daily at 12 noon.       Diet: routine diet  Activity: Advance as tolerated. Pelvic rest for 6 weeks.   Outpatient follow up:6 weeks Follow up Appt:No future appointments. Follow up visit: No follow-ups on file.  Postpartum contraception: Not Discussed  Newborn Data: Live born female  Birth Weight: 7 lb 5.2 oz (3323 g) APGAR: 9, 9  Newborn Delivery   Birth date/time:  04/24/2018 20:56:00 Delivery type:  Vaginal, Spontaneous     Baby Feeding: Breast Disposition:home with mother   04/26/2018 Lendon Colonel, MD

## 2019-04-15 LAB — OB RESULTS CONSOLE RUBELLA ANTIBODY, IGM: Rubella: IMMUNE

## 2019-04-15 LAB — OB RESULTS CONSOLE RPR: RPR: NONREACTIVE

## 2019-04-15 LAB — OB RESULTS CONSOLE HIV ANTIBODY (ROUTINE TESTING): HIV: NONREACTIVE

## 2019-04-15 LAB — OB RESULTS CONSOLE HEPATITIS B SURFACE ANTIGEN: Hepatitis B Surface Ag: NEGATIVE

## 2019-04-16 LAB — OB RESULTS CONSOLE GC/CHLAMYDIA
Chlamydia: NEGATIVE
Gonorrhea: NEGATIVE

## 2019-04-17 ENCOUNTER — Encounter: Payer: Self-pay | Admitting: Obstetrics and Gynecology

## 2019-06-21 NOTE — L&D Delivery Note (Signed)
Delivery Note  First Stage: Induction of labor 10/24/2019 at midnight for new onset Gestational hypertension with buccal Cytotec x 2 doses, then intracervical balloon and Pitocin.  Labor onset: 1330 Augmentation : AROM Analgesia /Anesthesia intrapartum: Stadol x 2 doses and epidural  AROM at 1330  Second Stage: Complete dilation at 2006 after reduction of swollen anterior lip Onset of pushing at 2006 FHR second stage Category   At 2013, Delivery of a viable, healthy female "[facility]" by Carlean Jews, CNM in LOA position. Loose nuchal cord x 1 reduced over head before delivery. Anterior shoulder and body followed quickly Cord double clamped after cessation of pulsation, cut by FOB Cord blood sample collected   Third Stage: Active management for hx of PPH x 2 - TXA given immediately after delivery.  Pitocin bolus given and Cytotec given rectally. Placenta delivered via Tomasa Blase intact with 3 VC @ 2019 Placenta disposition: hospital disposal  Uterine tone firm with massage / bleeding - well controlled  Perineal skin splay at introitus, hemostatic, 1 interrupted stitch placed to reapproximate the skin Anesthesia for repair: epidural  Repair: 3.0 vicryl on SH Est. Blood Loss (mL):  Complications: none  Mom to postpartum.  Baby to Couplet care / Skin to Skin.  Newborn: Birth Weight: pending  Apgar Scores: 8, 9 Feeding planned: breast  Carlean Jews, MSN, CNM [facility]

## 2019-07-11 ENCOUNTER — Encounter (HOSPITAL_COMMUNITY): Payer: Self-pay | Admitting: *Deleted

## 2019-07-16 ENCOUNTER — Other Ambulatory Visit: Payer: Self-pay

## 2019-07-16 ENCOUNTER — Ambulatory Visit (HOSPITAL_COMMUNITY): Payer: Self-pay | Admitting: Genetic Counselor

## 2019-07-16 ENCOUNTER — Ambulatory Visit (HOSPITAL_COMMUNITY): Payer: BLUE CROSS/BLUE SHIELD | Attending: Obstetrics and Gynecology | Admitting: Genetic Counselor

## 2019-07-16 DIAGNOSIS — Z3A23 23 weeks gestation of pregnancy: Secondary | ICD-10-CM

## 2019-07-16 DIAGNOSIS — O352XX0 Maternal care for (suspected) hereditary disease in fetus, not applicable or unspecified: Secondary | ICD-10-CM

## 2019-07-16 DIAGNOSIS — Z315 Encounter for genetic counseling: Secondary | ICD-10-CM | POA: Diagnosis not present

## 2019-07-16 NOTE — Progress Notes (Signed)
07/16/2019  Amber Daniel 1995/06/25 MRN: [id number] DOV: 07/16/2019  Ms. Amber Daniel presented to the [facility] for a genetics consultation regarding the history of a genetic condition in her husband. Ms. Amber Daniel was accompanied to her appointment by her husband, Amber Daniel.   Indication for genetic counseling - Husband with Noonan syndrome  Prenatal history  Ms. Amber Daniel is a G68P2003, 24 y.o. female. Her current pregnancy has completed [redacted]w[redacted]d(Estimated Date of Delivery: 11/10/19).  [name] exposure to environmental toxins or chemical agents. She denied the use of alcohol, tobacco or street drugs. She reported taking prenatal vitamins, DHA, and iron. She denied significant viral illnesses, fevers, and bleeding during the course of her pregnancy. Her medical and surgical histories were noncontributory.  Family History  A three generation pedigree was drafted and reviewed. The family history is remarkable for the following:  - Ms. MMosquerahas several relatives who have had 3 miscarriages. We reviewed that there are many potential causes of recurrent miscarriages, including anatomic, immunologic, endocrine, and genetic factors. However, a cause is only identified in 50% of individuals who experience recurrent miscarriages. Abnormalities of chromosome number or structure are the most common cause of sporadic early pregnancy loss. A significant proportion of recurrent miscarriages may also be associated with structural or numerical chromosome abnormalities. We discussed that 2-5% of couples who experience multiple miscarriages carry a balanced translocation that can become unbalanced and potentially lethal in their offspring. In addition to chromosomal abnormalities, certain single gene, X-linked, and polygenic/multifactorial disorders are associated with recurrent miscarriage. Ms. MSeiboldwas informed if they were interested, her relatives could have an  evaluation with a prenatal genetic counselor.  - [name] a paternal uncle whose son has significant intellectual disability. He is in high school but is still wearing diapers. He also has a history of seizures. Ms. MGierkewas not aware if her cousin has a specific diagnosis. Given that this relative is distantly related to Ms. Amber Daniel's children, it is unlikely that they have a significantly increased risk for a similar condition. However, without knowing the precise etiology for this family member's symptoms, precise risk assessment is limited.  - Ms. Amber Daniel's husband, PBrennley Daniel has Noonan syndrome. He was initially evaluated and diagnosed with Noonan syndrome at age 389due to a history of lymphedema. He also has other features associated with Noonan syndrome, such as widely-spaced nipples, low-set ears, a short webbed neck, short stature, a heart murmur, and high frequency hearing loss. He also began having seizures this year. Mr. MLoughmillerbelieves he had genetic [place], [place] which specific gene he was found to have a variant in. Mr. Amber Daniel and maternal grandmother also reportedly have lymphedema but have not been evaluated for Noonan syndrome. See Discussion section for more details.   The remaining family histories were reviewed and found to be noncontributory for other birth defects, intellectual disability, recurrent pregnancy loss, and known genetic conditions.    The patient's ethnicity [place] The father of the pregnancy's ethnicity is [place] Ashkenazi Jewish ancestry and consanguinity were denied. Pedigree will be scanned under Media.  Discussion  Ms. MMolinawas referred for genetic counseling due to her partner's history of Noonan syndrome. Noonan syndrome is a condition that affects 1 in 1000 to 1 in 2500 people. It is characterized by short stature, congenital heart defects (CHDs),  bleeding problems, and skeletal abnormalities. Additional signs and symptoms  of Noonan syndrome vary, but can include characteristic craniofacial features, vision abnormalities such as strabismus, cataracts, and nystagmus, hearing loss, elastic skin, thick/curly hair or thin/sparse hair, dystrophic nails, dental malocclusion, hypotonia, ribcage or breastbone abnormalities, scoliosis, feeding difficulties, renal anomalies such as renal pelvis dilation or renal agenesis, cryptorchidism, delayed puberty, female fertility complications, easy bleeding, and lymphedema. Approximately 50-80% of individuals with Noonan syndrome have a CHD, with the majority having hypertrophic cardiomyopathy or pulmonary valve stenosis. Additionally, 50-70% of individuals with Noonan syndrome have short stature. Noonan syndrome confers a slightly increased risk for malignancy, most commonly juvenile myelomonocytic leukemia, neuroblastoma, or embryonal rhabdomyosarcoma. However, risks for these cancers are very low and there are no established screening guidelines to monitor for these cancers. Although most individuals with Noonan syndrome have normal intelligence, approximately 33% have a learning disability or mild intellectual disability. Noonan syndrome is variably expressed, meaning that symptoms and severity differ from person to person, even those within the same family. Diagnosis may be made based on clinical features or by genetic testing. Features of Noonan syndrome often become less severe with age.  Noonan syndrome can be caused by heterozygous pathogenic variants in several genes. Approximately 50% of affected individuals have a pathogenic variant in the PTPN11 gene, 10-15% have a variant in the SOS1 gene, 5% have a variant in the RAF1 or RIT1 genes, and <5% have a variant in the KRAS gene. These genes provide instructions for creating proteins that serve important functions in regulating cell growth and division. The cause of  Noonan syndrome is unknown in 15-20% of affected individuals. Mr. 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 He is going to look through his old medical records and send me a copy of his genetic testing report if he is able to find it.   Noonan syndrome is inherited in an autosomal dominant fashion. This means that having one pathogenic variant in a Noonan-associated gene is sufficient to cause symptoms of Noonan syndrome. Approximately 30-75% of individuals with Noonan syndrome inherit a gene change from an affected parent. The remaining individuals with Noonan syndrome are born with de novo (new) mutations in a Noonan-associated gene. These cases are sporadic and occur in individuals with no family history of Noonan syndrome. Since Mr. Griffing has Noonan syndrome, he has a 1 in 2 (50%) chance of passing his altered Noonan-associated gene variant on with each pregnancy; thus, the couple's children all have a 1 in 2 (50%) chance of also having Noonan syndrome.   We reviewed that there are several options to determine if a fetus will be affected by Noonan syndrome. Firstly, we discussed a single gene noninvasive prenatal screening (NIPS) option called Vistara. Vistarascreens for 25 autosomal or X-linked dominant single gene disorders. One of the conditions included on Vistara's single gene panel is Noonan syndrome. Vistara can reportedly detect 92-96% of cases Noonan syndrome.However, this test is NOT diagnostic. Secondly, the couple was counseled about the option of diagnostic testing for Noonan syndrome. Diagnostic testing via amniocentesis is available in the current pregnancy to determine whether the fetus has Noonan syndrome. We discussed the technical aspects of the procedure and quoted up to a 1 in 500 (0.2%) risk for spontaneous pregnancy loss or other adverse  pregnancy outcomes as a result of the procedure. Cells from an amniotic fluid sample can be tested for a single gene/familial variant if Mr. Winker is able  to find his old genetic testing report. Testing for a panel of Noonan-associated genes on an amniocentesis sample is also possible. However, it would be difficult to determine if a fetus with a negative testing result on a Noonan-associated panel truly does not have Noonan syndrome or if Mr. Nowland specific gene variant was missed. Additionally, we reviewed that if a fetus was confirmed to have a pathogenic variant in a Noonan-associated gene, it would not be possible to predict the symptoms the baby would have after birth. For example, we would not be able to determine whether or not a baby would having learning difficulties during pregnancy. Cultured cells from an amniocentesis sample also allow for the visualization of a fetal karyotype, which can detect >99% of chromosomal aberrations. Chromosomal microarray can also be performed to identify smaller deletions or duplications of fetal chromosomal material.   We also discussed ultrasound's role in determining whether or not a fetus has Noonan syndrome prenatally. Prenatal ultrasound findings associated with Noonan syndrome can include cardiac defects, cystic hygroma, increased nuchal translucency, hydrops, polyhydramnios, lymphatic dysplasia, relative macrocephaly, pleural and pericardial effusion, ascites, and renal anomalies. However, these features are nonspecific and can also be identified in fetuses with other genetic conditions. Additionally, not every fetus with Noonan syndrome will demonstrate signs of the condition on ultrasound. The couple reported that there have been no visualized fetal anomalies suggestive of Noonan syndrome on ultrasound. Additionally, Ms. Langston recently underwent a fetal echocardiogram which was normal.  The couple indicated that they may be interested in pursuing  prenatal screening via Vistara or amniocentesis for Noonan syndrome. However, they requested more time to consider their testing options before coming to a final decision. We made a plan for them to contact me if they would like to pursue Vistara or amniocentesis. In the meantime, Mr. Macioce will attempt to find medical records from when he was first evaluated for Noonan syndrome.  Mr. Kristiansen also had several questions related to his health. Given that he is now 23 years old and has not been evaluated since he was 70, I asked if he would be interested in a referral to an adult genetics clinic, which Mr. Seubert agreed to. He indicated that he would is interesting in discussing features that have developed in adulthood and learning about any screening options that may be recommended. I will place a referral for Mr. Anabel Bene at the [facility] adult genetics clinic.  I provided the couple with several resources, including a pamphlet about Vistara and a printed copy of the Noonan syndrome Powerpoint I created for this session. I will assist in coordinating screening/testing once the couple contacts me.   I counseled Ms. Maclachlan regarding the above risks and available options. The approximate face-to-face time with the genetic counselor was 45 minutes.  In summary:  Discussed husband's history of Noonan syndrome and options for follow-up testing  1 in 2 (50%) chance of having baby with Noonan syndrome  May or may not demonstrate signs of condition on ultrasound  Ultrasounds & fetal echocardiogram reportedly normal  Screening for Noonan syndrome available via Vistara single-gene NIPS  Prenatal diagnosis for Noonan syndrome amniocentesis possible via amniocentesis (targeted variant analysis vs. Noonan syndrome gene panel)  Potentially interested in screening/testing for Noonan syndrome. They will contact me if interested and I will facilitate testing from there  Offered additional testing  and screening  Husband desires referral to adult genetics clinic. I will place referral to [facility] other family  history concerns   Buelah Manis, MS Genetic Counselor

## 2019-08-19 ENCOUNTER — Other Ambulatory Visit: Payer: Self-pay | Admitting: Gastroenterology

## 2019-08-19 DIAGNOSIS — R1900 Intra-abdominal and pelvic swelling, mass and lump, unspecified site: Secondary | ICD-10-CM

## 2019-10-19 ENCOUNTER — Inpatient Hospital Stay (HOSPITAL_COMMUNITY)
Admission: AD | Admit: 2019-10-19 | Discharge: 2019-10-19 | Disposition: A | Discharge status: Home / Self Care | Payer: BLUE CROSS/BLUE SHIELD | Source: Ambulatory Visit | Attending: Obstetrics | Admitting: Obstetrics

## 2019-10-19 ENCOUNTER — Other Ambulatory Visit: Payer: Self-pay

## 2019-10-19 ENCOUNTER — Encounter (HOSPITAL_COMMUNITY): Payer: Self-pay | Admitting: Obstetrics

## 2019-10-19 DIAGNOSIS — O26893 Other specified pregnancy related conditions, third trimester: Secondary | ICD-10-CM | POA: Diagnosis not present

## 2019-10-19 DIAGNOSIS — Z87891 Personal history of nicotine dependence: Secondary | ICD-10-CM | POA: Insufficient documentation

## 2019-10-19 DIAGNOSIS — O10013 Pre-existing essential hypertension complicating pregnancy, third trimester: Secondary | ICD-10-CM | POA: Diagnosis not present

## 2019-10-19 DIAGNOSIS — O24113 Pre-existing diabetes mellitus, type 2, in pregnancy, third trimester: Secondary | ICD-10-CM | POA: Diagnosis not present

## 2019-10-19 DIAGNOSIS — R1011 Right upper quadrant pain: Secondary | ICD-10-CM | POA: Diagnosis not present

## 2019-10-19 DIAGNOSIS — Z79899 Other long term (current) drug therapy: Secondary | ICD-10-CM | POA: Diagnosis not present

## 2019-10-19 DIAGNOSIS — Z3A36 36 weeks gestation of pregnancy: Secondary | ICD-10-CM

## 2019-10-19 DIAGNOSIS — R030 Elevated blood-pressure reading, without diagnosis of hypertension: Secondary | ICD-10-CM | POA: Diagnosis not present

## 2019-10-19 DIAGNOSIS — E119 Type 2 diabetes mellitus without complications: Secondary | ICD-10-CM | POA: Diagnosis not present

## 2019-10-19 LAB — COMPREHENSIVE METABOLIC PANEL
ALT: 25 U/L (ref 0–44)
AST: 35 U/L (ref 15–41)
Albumin: 2.7 g/dL — ABNORMAL LOW (ref 3.5–5.0)
Alkaline Phosphatase: 128 U/L — ABNORMAL HIGH (ref 38–126)
Anion gap: 9 (ref 5–15)
BUN: 14 mg/dL (ref 6–20)
CO2: 19 mmol/L — ABNORMAL LOW (ref 22–32)
Calcium: 9.4 mg/dL (ref 8.9–10.3)
Chloride: 107 mmol/L (ref 98–111)
Creatinine, Ser: 0.65 mg/dL (ref 0.44–1.00)
GFR calc Af Amer: >60 mL/min (ref >60)
GFR calc non Af Amer: >60 mL/min (ref >60)
Glucose, Bld: 90 mg/dL (ref 70–99)
Potassium: 4.2 mmol/L (ref 3.5–5.1)
Sodium: 135 mmol/L (ref 135–145)
Total Bilirubin: 0.3 mg/dL (ref 0.3–1.2)
Total Protein: 6.5 g/dL (ref 6.5–8.1)

## 2019-10-19 LAB — CBC
HCT: 31.4 % — ABNORMAL LOW (ref 36.0–46.0)
Hemoglobin: 9.9 g/dL — ABNORMAL LOW (ref 12.0–15.0)
MCH: 26.7 pg (ref 26.0–34.0)
MCHC: 31.5 g/dL (ref 30.0–36.0)
MCV: 84.6 fL (ref 80.0–100.0)
Platelets: 254 10*3/uL (ref 150–400)
RBC: 3.71 MIL/uL — ABNORMAL LOW (ref 3.87–5.11)
RDW: 15.1 % (ref 11.5–15.5)
WBC: 12 10*3/uL — ABNORMAL HIGH (ref 4.0–10.5)
nRBC: 0.2 % (ref 0.0–0.2)

## 2019-10-19 LAB — PROTEIN / CREATININE RATIO, URINE
Creatinine, Urine: 81.47 mg/dL
Protein Creatinine Ratio: 0.22 mg/mg{Cre} — ABNORMAL HIGH (ref 0.00–0.15)
Total Protein, Urine: 18 mg/dL

## 2019-10-19 NOTE — MAU Provider Note (Signed)
History     CSN: [id number]  Arrival date and time: 10/19/19 2049   None     Chief Complaint  Patient presents with  . Hypertension   HPI  Amber Daniel is a 24 year old G4P2002 at 36.[redacted] weeks EGA who is presenting to MAU with complaints of high BP. She got a new BP cuff today, and took her BP at home- reporting BP of 140s/80s-90s x2. She denies SOB, vision changes. FOB at bedside- reports that when she took her blood pressure they had their kids running around and she was "stressed out."  She reports having some RUQ pain, but reports it is more like pressure and she has been having it for some time- at least several weeks. She also reports having a known, benign liver mass.  She is feeling her baby move, denies vaginal bleeding or leakage of fluids. She reports Braxton Hicks contractions.  OB History    Gravida  4   Para  3   Term  2   Preterm  0   AB  0   Living  3     SAB  0   TAB  0   Ectopic  0   Multiple  0   Live Births  3           Past Medical History:  Diagnosis Date  . Anemia   . Depression    major in high school  . Diabetes mellitus without complication ([place])   . Gestational diabetes     Past Surgical History:  Procedure Laterality Date  . NO PAST SURGERIES      Family History  Problem Relation Age of Onset  . Diabetes Father   . Hypertension Father   . Hypertension Paternal Grandfather   . Heart disease Paternal Grandfather     Social History   Tobacco Use  . Smoking status: Former Research scientist (life sciences)  . Smokeless tobacco: Never Used  Substance Use Topics  . Alcohol use: Not Currently    Comment: not in pregnancy  . Drug use: Yes    Types: Marijuana    Comment: not since 24 years old    Allergies: No Known Allergies  Medications Prior to Admission  Medication Sig Dispense Refill Last Dose  . ferrous sulfate 325 (65 FE) MG tablet Take 325 mg by mouth 2 (two) times daily with a meal.   10/19/2019 at Unknown time  . Prenatal Vit-Fe  Fumarate-FA (PRENATAL MULTIVITAMIN) TABS tablet Take 2 tablets by mouth daily at 12 noon.    10/19/2019 at Unknown time  . ibuprofen (ADVIL,MOTRIN) 600 MG tablet Take 1 tablet (600 mg total) by mouth every 6 (six) hours. 30 tablet 0     Review of Systems  All other systems reviewed and are negative.  Physical Exam   Blood pressure 133/75, pulse 91, last menstrual period 02/03/2019, unknown if currently breastfeeding.  Physical Exam  Nursing note and vitals reviewed. Constitutional: She is oriented to person, place, and time. She appears well-developed and well-nourished.  HENT:  Head: Normocephalic and atraumatic.  Eyes: Pupils are equal, round, and reactive to light. Conjunctivae and EOM are normal.  Cardiovascular: Normal rate, regular rhythm, normal heart sounds and intact distal pulses.  Respiratory: Effort normal and breath sounds normal.  GI: Soft. Bowel sounds are normal. She exhibits no distension and no mass. There is no abdominal tenderness. There is no rebound and no guarding.  Gravid  Musculoskeletal:        General: Normal  range of motion.     Cervical back: Normal range of motion and neck supple.  Neurological: She is alert and oriented to person, place, and time. She has normal reflexes.  Skin: Skin is warm and dry.  Psychiatric: She has a normal mood and affect. Her behavior is normal. Judgment and thought content normal.    MAU Course  Procedures  MDM -BP normotensive in MAU -Pre-E labs  NST -baseline: 125 -variability: moderate -accels: 15x15 -decels: none -interpretation: reactive  Results for orders placed or performed during the hospital encounter of 10/19/19 (from the past 24 hour(s))  Protein / creatinine ratio, urine     Status: Abnormal   Collection Time: 10/19/19  9:38 PM  Result Value Ref Range   Creatinine, Urine 81.47 mg/dL   Total Protein, Urine 18 mg/dL   Protein Creatinine Ratio 0.22 (H) 0.00 - 0.15 mg/mg[Cre]  CBC     Status: Abnormal    Collection Time: 10/19/19  9:39 PM  Result Value Ref Range   WBC 12.0 (H) 4.0 - 10.5 K/uL   RBC 3.71 (Daniel) 3.87 - 5.11 MIL/uL   Hemoglobin 9.9 (Daniel) 12.0 - 15.0 g/dL   HCT 51.0 (Daniel) 25.8 - 52.7 %   MCV 84.6 80.0 - 100.0 fL   MCH 26.7 26.0 - 34.0 pg   MCHC 31.5 30.0 - 36.0 g/dL   RDW 78.2 42.3 - 53.6 %   Platelets 254 150 - 400 K/uL   nRBC 0.2 0.0 - 0.2 %  Comprehensive metabolic panel     Status: Abnormal   Collection Time: 10/19/19  9:39 PM  Result Value Ref Range   Sodium 135 135 - 145 mmol/Daniel   Potassium 4.2 3.5 - 5.1 mmol/Daniel   Chloride 107 98 - 111 mmol/Daniel   CO2 19 (Daniel) 22 - 32 mmol/Daniel   Glucose, Bld 90 70 - 99 mg/dL   BUN 14 6 - 20 mg/dL   Creatinine, Ser 1.44 0.44 - 1.00 mg/dL   Calcium 9.4 8.9 - 31.5 mg/dL   Total Protein 6.5 6.5 - 8.1 g/dL   Albumin 2.7 (Daniel) 3.5 - 5.0 g/dL   AST 35 15 - 41 U/Daniel   ALT 25 0 - 44 U/Daniel   Alkaline Phosphatase 128 (H) 38 - 126 U/Daniel   Total Bilirubin 0.3 0.3 - 1.2 mg/dL   GFR calc non Af Amer >60 >60 mL/min   GFR calc Af Amer >60 >60 mL/min   Anion gap 9 5 - 15   Today's Vitals   10/19/19 2215 10/19/19 2230 10/19/19 2245 10/19/19 2300  BP: 133/75 126/72 (!) 109/54 (!) 110/52  Pulse: 91 85 76 80   There is no height or weight on file to calculate BMI.   Assessment and Plan  24 year old G4P2002 at 36.[redacted] weeks EGA who is presenting to MAU with complaints of high BP -Educated on proper way to take BP at home -Recommended taking BP cuff to office on Monday for regular appointment to test it against office cuff -Pre-E labs normal -DC to home in stable condition -Return precautions discussed  Amber Daniel Amber Daniel 10/19/2019, 10:26 PM

## 2019-10-19 NOTE — MAU Note (Signed)
Pt stated she took her b/p several times tonight and it is running 140's/80-90 Denies headache tonight but has had some upper right abd pain and has pedal edema. Good fetal movement reported.

## 2019-10-19 NOTE — Discharge Instructions (Signed)

## 2019-10-23 ENCOUNTER — Other Ambulatory Visit: Payer: Self-pay | Admitting: Obstetrics and Gynecology

## 2019-10-23 NOTE — H&P (Signed)
OB ADMISSION/ HISTORY & PHYSICAL:  Admission Date: 10/24/2019 12:19 AM  Admit Diagnosis: 37+4 weeks IOL for GHTN  Amber Daniel is a 24 y.o. female G4P3 at 37+4 weeks presenting for induction of labor for new onset gestational hypertension without proteinuria.  Documented BPs in the office on 4/29 and again on 5/5 showed 146/86, 140/96, and 125/91.  Pt. Has been reporting intermittent mild headaches for weeks, dizziness, SOB, and increase in swelling.   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 Of Benign liver mass - s/p GI consult during pregnancy - Iron deficiency anemia  - GBS bacteruria - Excessive weight gain 59# in pregnancy - LGA fetus  - Hx. Of A2GDM with last pregnancy, passed early and 28 wk GTT this pregnancy  Prenatal Labs: ABO, Rh: --/--/O POS, O POS Performed at [facility], [address] [place], [place] [address]  (05/06 0100) Antibody: NEG (05/06 0100) Rubella:  Immune  RPR:   NR HBsAg:   Neg HIV:   NR GTT: ear;ly 1 hr: 120; repeat at 28 weeks: 9 GBS:   Positive Panorama: low risk, female AFP-1: Negative CUS: normal XX anatomy, anterior placenta, suboptimal views of face Growth at 36 weeks: EFW 8#@ 98%, AC 99%, HC 75%, BPP on 5/5: 8/8, AFI 9.2cm   Medical / Surgical History :  Past medical history:  Past Medical History:  Diagnosis Date  . Anemia   . Depression    major in high school  . Diabetes mellitus without complication ([facility])   . Gestational diabetes      Past surgical history:  Past Surgical History:  Procedure Laterality Date  . NO PAST SURGERIES      Family History:  Family History  Problem Relation Age of Onset  .  Diabetes Father   . Hypertension Father   . Hypertension Paternal Grandfather   . Heart disease Paternal Grandfather      Social History:  reports that she has quit smoking. She has never used smokeless tobacco. She reports previous alcohol use. She reports current drug use. Drug: Marijuana.   Allergies: Patient has no known allergies.    Current Medications at time of admission:  Prior to Admission medications   Medication Sig Start Date End Date Taking? Authorizing Provider  ferrous sulfate 325 (65 FE) MG tablet Take 325 mg by mouth 2 (two) times daily with a meal.    [provider]  Prenatal Vit-Fe Fumarate-FA (PRENATAL MULTIVITAMIN) TABS tablet Take 2 tablets by mouth daily at 12 noon.     [provider]     Review of Systems: Active FM Irregular BH ctx No LOF  / SROM No bloody show  (+) LE edema (+) Intermittent Mild headache (+) dizziness (+) mild SOB   Physical Exam:  VS: Blood pressure 130/84, pulse 75, temperature 98.1 F (36.7 C), temperature source Oral, resp. rate 17, height 5\' 2"  (1.575 m), weight 84.1 kg, last menstrual period 02/03/2019, unknown if currently breastfeeding.  General: alert and oriented Heart: RRR Lungs: Clear lung fields Abdomen: Gravid, soft and non-tender, non-distended / uterus: EFW 8#8oz Extremities: +1 LE edema, +2 DTRs, no clonus  Genitalia / VE: Dilation: 1.5 Effacement (%):  [place]: -3 Exam by:: [employer] RN   FHR: baseline rate 130 bpm / variability moderate / accelerations +15x15 / no decelerations TOCO: every 2-4 minutes/ mild  Assessment: 37+[redacted] weeks gestation Induction for GHTN  FHR category 1 GBS positive   Plan:  1. Admit to [facility]   - Routine labor and delivery orders   - Cytotec 66mcg buccal every 4-6 hrs   - Continuous fetal monitoring   - Okay to saline lock IV in between PCN doses   - Pain management: IV Stadol PRN and epidural  2. GBS Positive - bacteruria   - PCN  every 4 hrs 3. Postpartum:   - Breast   - Contraception: ParaGard IUD 4. Anticipate MOD:   - Proven pelvis: 8#1oz   Dr. Benjie Karvonen notified of admission / plan of care  Lars Pinks, MSN, [facility]

## 2019-10-24 ENCOUNTER — Inpatient Hospital Stay (HOSPITAL_COMMUNITY): Payer: BLUE CROSS/BLUE SHIELD

## 2019-10-24 ENCOUNTER — Inpatient Hospital Stay (HOSPITAL_COMMUNITY)
Admission: AD | Admit: 2019-10-24 | Discharge: 2019-10-26 | DRG: 806 | Disposition: A | Discharge status: Home / Self Care | Payer: BLUE CROSS/BLUE SHIELD | Attending: Obstetrics | Admitting: Obstetrics

## 2019-10-24 ENCOUNTER — Inpatient Hospital Stay (HOSPITAL_COMMUNITY): Payer: BLUE CROSS/BLUE SHIELD | Admitting: Anesthesiology

## 2019-10-24 ENCOUNTER — Other Ambulatory Visit: Payer: Self-pay

## 2019-10-24 ENCOUNTER — Encounter (HOSPITAL_COMMUNITY): Payer: Self-pay | Admitting: Obstetrics and Gynecology

## 2019-10-24 DIAGNOSIS — Z3A37 37 weeks gestation of pregnancy: Secondary | ICD-10-CM | POA: Diagnosis not present

## 2019-10-24 DIAGNOSIS — D62 Acute posthemorrhagic anemia: Secondary | ICD-10-CM | POA: Diagnosis not present

## 2019-10-24 DIAGNOSIS — O3663X0 Maternal care for excessive fetal growth, third trimester, not applicable or unspecified: Secondary | ICD-10-CM | POA: Diagnosis present

## 2019-10-24 DIAGNOSIS — F1290 Cannabis use, unspecified, uncomplicated: Secondary | ICD-10-CM | POA: Diagnosis present

## 2019-10-24 DIAGNOSIS — Z87891 Personal history of nicotine dependence: Secondary | ICD-10-CM

## 2019-10-24 DIAGNOSIS — O139 Gestational [pregnancy-induced] hypertension without significant proteinuria, unspecified trimester: Secondary | ICD-10-CM

## 2019-10-24 DIAGNOSIS — O9081 Anemia of the puerperium: Secondary | ICD-10-CM | POA: Diagnosis not present

## 2019-10-24 DIAGNOSIS — O99824 Streptococcus B carrier state complicating childbirth: Secondary | ICD-10-CM | POA: Diagnosis present

## 2019-10-24 DIAGNOSIS — O99324 Drug use complicating childbirth: Secondary | ICD-10-CM | POA: Diagnosis present

## 2019-10-24 DIAGNOSIS — Z20822 Contact with and (suspected) exposure to covid-19: Secondary | ICD-10-CM | POA: Diagnosis present

## 2019-10-24 DIAGNOSIS — O134 Gestational [pregnancy-induced] hypertension without significant proteinuria, complicating childbirth: Principal | ICD-10-CM | POA: Diagnosis present

## 2019-10-24 DIAGNOSIS — Z3490 Encounter for supervision of normal pregnancy, unspecified, unspecified trimester: Secondary | ICD-10-CM | POA: Diagnosis present

## 2019-10-24 DIAGNOSIS — D509 Iron deficiency anemia, unspecified: Secondary | ICD-10-CM | POA: Diagnosis present

## 2019-10-24 LAB — CBC
HCT: 31.6 % — ABNORMAL LOW (ref 36.0–46.0)
HCT: 32.6 % — ABNORMAL LOW (ref 36.0–46.0)
Hemoglobin: 10.1 g/dL — ABNORMAL LOW (ref 12.0–15.0)
Hemoglobin: 10 g/dL — ABNORMAL LOW (ref 12.0–15.0)
MCH: 27.6 pg (ref 26.0–34.0)
MCH: 26.8 pg (ref 26.0–34.0)
MCHC: 32 g/dL (ref 30.0–36.0)
MCHC: 30.7 g/dL (ref 30.0–36.0)
MCV: 86.3 fL (ref 80.0–100.0)
MCV: 87.4 fL (ref 80.0–100.0)
Platelets: 236 10*3/uL (ref 150–400)
Platelets: 212 10*3/uL (ref 150–400)
RBC: 3.66 MIL/uL — ABNORMAL LOW (ref 3.87–5.11)
RBC: 3.73 MIL/uL — ABNORMAL LOW (ref 3.87–5.11)
RDW: 15.9 % — ABNORMAL HIGH (ref 11.5–15.5)
RDW: 16.5 % — ABNORMAL HIGH (ref 11.5–15.5)
WBC: 12.8 10*3/uL — ABNORMAL HIGH (ref 4.0–10.5)
WBC: 17.7 10*3/uL — ABNORMAL HIGH (ref 4.0–10.5)
nRBC: 0.2 % (ref 0.0–0.2)
nRBC: 0 % (ref 0.0–0.2)

## 2019-10-24 LAB — COMPREHENSIVE METABOLIC PANEL
ALT: 27 U/L (ref 0–44)
AST: 38 U/L (ref 15–41)
Albumin: 2.8 g/dL — ABNORMAL LOW (ref 3.5–5.0)
Alkaline Phosphatase: 155 U/L — ABNORMAL HIGH (ref 38–126)
Anion gap: 10 (ref 5–15)
BUN: 15 mg/dL (ref 6–20)
CO2: 22 mmol/L (ref 22–32)
Calcium: 10.1 mg/dL (ref 8.9–10.3)
Chloride: 104 mmol/L (ref 98–111)
Creatinine, Ser: 0.53 mg/dL (ref 0.44–1.00)
GFR calc Af Amer: >60 mL/min (ref >60)
GFR calc non Af Amer: >60 mL/min (ref >60)
Glucose, Bld: 93 mg/dL (ref 70–99)
Potassium: 4.6 mmol/L (ref 3.5–5.1)
Sodium: 136 mmol/L (ref 135–145)
Total Bilirubin: 0.3 mg/dL (ref 0.3–1.2)
Total Protein: 5.8 g/dL — ABNORMAL LOW (ref 6.5–8.1)

## 2019-10-24 LAB — TYPE AND SCREEN
ABO/RH(D): O POS
Antibody Screen: NEGATIVE

## 2019-10-24 LAB — RPR: RPR Ser Ql: NONREACTIVE

## 2019-10-24 LAB — PROTEIN / CREATININE RATIO, URINE
Creatinine, Urine: 38.37 mg/dL
Protein Creatinine Ratio: 0.23 mg/mg{Cre} — ABNORMAL HIGH (ref 0.00–0.15)
Total Protein, Urine: 9 mg/dL

## 2019-10-24 LAB — RESPIRATORY PANEL BY RT PCR (FLU A&B, COVID)
Influenza A by PCR: NEGATIVE
Influenza B by PCR: NEGATIVE
SARS Coronavirus 2 by RT PCR: NEGATIVE

## 2019-10-24 LAB — URIC ACID: Uric Acid, Serum: 5.8 mg/dL (ref 2.5–7.1)

## 2019-10-24 LAB — ABO/RH: ABO/RH(D): O POS

## 2019-10-24 LAB — LACTATE DEHYDROGENASE: LDH: 198 U/L — ABNORMAL HIGH (ref 98–192)

## 2019-10-24 MED ORDER — SODIUM CHLORIDE 0.9% FLUSH: 3.0000 mL | Freq: Two times a day (BID) | INTRAVENOUS | Status: DC

## 2019-10-24 MED ORDER — DIPHENHYDRAMINE HCL 50 MG/ML IJ SOLN: 12.5000 mg | INTRAMUSCULAR | Status: DC | PRN

## 2019-10-24 MED ORDER — LIDOCAINE HCL (PF) 1 % IJ SOLN: 30.0000 mL | INTRAMUSCULAR | Status: DC | PRN

## 2019-10-24 MED ORDER — FENTANYL-BUPIVACAINE-NACL 0.5-0.125-0.9 MG/250ML-% EP SOLN
12.0000 mL/h | EPIDURAL | Status: DC | PRN
  Filled 2019-10-24: qty 250

## 2019-10-24 MED ORDER — TRANEXAMIC ACID-NACL 1000-0.7 MG/100ML-% IV SOLN
1000.0000 mg | Freq: Once | INTRAVENOUS | Status: AC
  Administered 2019-10-24: 1000 mg via INTRAVENOUS
  Filled 2019-10-24: qty 100

## 2019-10-24 MED ORDER — OXYTOCIN 40 UNITS IN NORMAL SALINE INFUSION - SIMPLE MED
2.5000 [IU]/h | INTRAVENOUS | Status: DC
  Administered 2019-10-24: 2.5 [IU]/h via INTRAVENOUS

## 2019-10-24 MED ORDER — WITCH HAZEL-GLYCERIN EX PADS: 1.0000 "application " | MEDICATED_PAD | CUTANEOUS | Status: DC | PRN

## 2019-10-24 MED ORDER — BENZOCAINE-MENTHOL 20-0.5 % EX AERO
1.0000 "application " | INHALATION_SPRAY | CUTANEOUS | Status: DC | PRN
  Administered 2019-10-24: 1 via TOPICAL
  Filled 2019-10-24: qty 56

## 2019-10-24 MED ORDER — MISOPROSTOL 200 MCG PO TABS
1000.0000 ug | ORAL_TABLET | Freq: Once | ORAL | Status: AC
  Administered 2019-10-24: 1000 ug via RECTAL
  Filled 2019-10-24: qty 5

## 2019-10-24 MED ORDER — ACETAMINOPHEN 500 MG PO TABS
1000.0000 mg | ORAL_TABLET | Freq: Four times a day (QID) | ORAL | Status: DC | PRN
  Administered 2019-10-25 – 2019-10-26 (×4): 1000 mg via ORAL
  Filled 2019-10-24 (×5): qty 2

## 2019-10-24 MED ORDER — SENNOSIDES-DOCUSATE SODIUM 8.6-50 MG PO TABS
2.0000 | ORAL_TABLET | ORAL | Status: DC
  Administered 2019-10-24 – 2019-10-26 (×2): 2 via ORAL
  Filled 2019-10-24 (×2): qty 2

## 2019-10-24 MED ORDER — PRENATAL MULTIVITAMIN CH
1.0000 | ORAL_TABLET | Freq: Every day | ORAL | Status: DC
  Administered 2019-10-25 – 2019-10-26 (×2): 1 via ORAL
  Filled 2019-10-24 (×2): qty 1

## 2019-10-24 MED ORDER — PENICILLIN G POT IN DEXTROSE 60000 UNIT/ML IV SOLN
3.0000 10*6.[IU] | INTRAVENOUS | Status: DC
  Administered 2019-10-24 (×4): 3 10*6.[IU] via INTRAVENOUS
  Filled 2019-10-24 (×4): qty 50

## 2019-10-24 MED ORDER — ZOLPIDEM TARTRATE 5 MG PO TABS: 5.0000 mg | ORAL_TABLET | Freq: Every evening | ORAL | Status: DC | PRN

## 2019-10-24 MED ORDER — IBUPROFEN 600 MG PO TABS: 600.0000 mg | ORAL_TABLET | Freq: Four times a day (QID) | ORAL | Status: DC

## 2019-10-24 MED ORDER — COCONUT OIL OIL
1.0000 "application " | TOPICAL_OIL | Status: DC | PRN
  Administered 2019-10-24: 1 via TOPICAL

## 2019-10-24 MED ORDER — SIMETHICONE 80 MG PO CHEW: 80.0000 mg | CHEWABLE_TABLET | ORAL | Status: DC | PRN

## 2019-10-24 MED ORDER — SOD CITRATE-CITRIC ACID 500-334 MG/5ML PO SOLN: 30.0000 mL | ORAL | Status: DC | PRN

## 2019-10-24 MED ORDER — DIBUCAINE (PERIANAL) 1 % EX OINT: 1.0000 "application " | TOPICAL_OINTMENT | CUTANEOUS | Status: DC | PRN

## 2019-10-24 MED ORDER — EPHEDRINE 5 MG/ML INJ: 10.0000 mg | INTRAVENOUS | Status: DC | PRN

## 2019-10-24 MED ORDER — ONDANSETRON HCL 4 MG/2ML IJ SOLN
4.0000 mg | Freq: Four times a day (QID) | INTRAMUSCULAR | Status: DC | PRN
  Administered 2019-10-24: 4 mg via INTRAVENOUS
  Filled 2019-10-24: qty 2

## 2019-10-24 MED ORDER — DIPHENHYDRAMINE HCL 25 MG PO CAPS: 25.0000 mg | ORAL_CAPSULE | Freq: Four times a day (QID) | ORAL | Status: DC | PRN

## 2019-10-24 MED ORDER — TERBUTALINE SULFATE 1 MG/ML IJ SOLN: 0.2500 mg | Freq: Once | INTRAMUSCULAR | Status: DC | PRN

## 2019-10-24 MED ORDER — BUTORPHANOL TARTRATE 1 MG/ML IJ SOLN
1.0000 mg | Freq: Once | INTRAMUSCULAR | Status: AC
  Administered 2019-10-24: 1 mg via INTRAVENOUS
  Filled 2019-10-24: qty 1

## 2019-10-24 MED ORDER — MISOPROSTOL 50MCG HALF TABLET
50.0000 ug | ORAL_TABLET | ORAL | Status: DC | PRN
  Administered 2019-10-24 (×2): 50 ug via ORAL
  Filled 2019-10-24 (×2): qty 1

## 2019-10-24 MED ORDER — IBUPROFEN 600 MG PO TABS
600.0000 mg | ORAL_TABLET | Freq: Four times a day (QID) | ORAL | Status: DC
  Administered 2019-10-24 – 2019-10-26 (×7): 600 mg via ORAL
  Filled 2019-10-24 (×7): qty 1

## 2019-10-24 MED ORDER — ONDANSETRON HCL 4 MG PO TABS: 4.0000 mg | ORAL_TABLET | ORAL | Status: DC | PRN

## 2019-10-24 MED ORDER — SODIUM CHLORIDE 0.9% FLUSH: 3.0000 mL | INTRAVENOUS | Status: DC | PRN

## 2019-10-24 MED ORDER — OXYTOCIN 40 UNITS IN NORMAL SALINE INFUSION - SIMPLE MED
1.0000 m[IU]/min | INTRAVENOUS | Status: DC
  Administered 2019-10-24: 2 m[IU]/min via INTRAVENOUS
  Filled 2019-10-24: qty 1000

## 2019-10-24 MED ORDER — LIDOCAINE HCL (PF) 1 % IJ SOLN
INTRAMUSCULAR | Status: DC | PRN
  Administered 2019-10-24: 10 mL via EPIDURAL
  Administered 2019-10-24: 2 mL via EPIDURAL

## 2019-10-24 MED ORDER — ONDANSETRON HCL 4 MG/2ML IJ SOLN: 4.0000 mg | INTRAMUSCULAR | Status: DC | PRN

## 2019-10-24 MED ORDER — SODIUM CHLORIDE 0.9 % IV SOLN
5.0000 10*6.[IU] | Freq: Once | INTRAVENOUS | Status: AC
  Administered 2019-10-24: 5 10*6.[IU] via INTRAVENOUS
  Filled 2019-10-24: qty 5

## 2019-10-24 MED ORDER — PHENYLEPHRINE 40 MCG/ML (10ML) SYRINGE FOR IV PUSH (FOR BLOOD PRESSURE SUPPORT)
80.0000 ug | PREFILLED_SYRINGE | INTRAVENOUS | Status: DC | PRN
  Filled 2019-10-24: qty 10

## 2019-10-24 MED ORDER — FERROUS SULFATE 325 (65 FE) MG PO TABS
325.0000 mg | ORAL_TABLET | Freq: Two times a day (BID) | ORAL | Status: DC
  Administered 2019-10-25: 325 mg via ORAL
  Filled 2019-10-24: qty 1

## 2019-10-24 MED ORDER — OXYTOCIN BOLUS FROM INFUSION
500.0000 mL | Freq: Once | INTRAVENOUS | Status: AC
  Administered 2019-10-24: 500 mL via INTRAVENOUS

## 2019-10-24 MED ORDER — SODIUM CHLORIDE 0.9 % IV SOLN: 250.0000 mL | INTRAVENOUS | Status: DC | PRN

## 2019-10-24 MED ORDER — PHENYLEPHRINE 40 MCG/ML (10ML) SYRINGE FOR IV PUSH (FOR BLOOD PRESSURE SUPPORT)
80.0000 ug | PREFILLED_SYRINGE | INTRAVENOUS | Status: DC | PRN
  Administered 2019-10-24: 80 ug via INTRAVENOUS

## 2019-10-24 MED ORDER — BUTORPHANOL TARTRATE 1 MG/ML IJ SOLN
1.0000 mg | INTRAMUSCULAR | Status: DC | PRN
  Administered 2019-10-24: 1 mg via INTRAVENOUS
  Filled 2019-10-24: qty 1

## 2019-10-24 MED ORDER — ACETAMINOPHEN 325 MG PO TABS: 650.0000 mg | ORAL_TABLET | ORAL | Status: DC | PRN

## 2019-10-24 MED ORDER — SODIUM CHLORIDE (PF) 0.9 % IJ SOLN
INTRAMUSCULAR | Status: DC | PRN
  Administered 2019-10-24: 12 mL/h via EPIDURAL

## 2019-10-24 MED ORDER — LACTATED RINGERS IV SOLN
500.0000 mL | Freq: Once | INTRAVENOUS | Status: AC
  Administered 2019-10-24: 500 mL via INTRAVENOUS

## 2019-10-24 NOTE — Progress Notes (Signed)
S: Comfortable, feeling intermittent pressure. C/o mild headache.   O:  VS: Blood pressure (!) 113/55, pulse 78, temperature 98.3 F (36.8 C), temperature source Oral, resp. rate 18, height 5\' 2"  (1.575 m), weight 84.1 kg, last menstrual period 02/03/2019, unknown if currently breastfeeding.  Patient Vitals for the past 24 hrs:  BP Temp Temp src Pulse Resp Height Weight  10/24/19 1730 (!) 113/55 -- -- 78 18 -- --  10/24/19 1700 (!) 107/55 -- -- 100 16 -- --  10/24/19 1630 (!) 112/56 -- -- 79 18 -- --  10/24/19 1600 (!) 114/53 98.3 F (36.8 C) Oral 76 16 -- --  10/24/19 1530 (!) 99/43 -- -- 71 18 -- --  10/24/19 1500 (!) 104/46 -- -- 75 18 -- --  10/24/19 1430 (!) 104/44 -- -- 73 16 -- --  10/24/19 1400 120/60 98.1 F (36.7 C) Oral 79 16 -- --  10/24/19 1330 123/68 -- -- 77 16 -- --  10/24/19 1300 (!) 115/55 -- -- 72 18 -- --  10/24/19 1231 (!) 104/48 -- -- 75 20 -- --  10/24/19 1215 119/69 97.9 F (36.6 C) Oral 85 18 -- --  10/24/19 1210 119/75 -- -- 82 18 -- --  10/24/19 1205 118/71 -- -- 87 16 -- --  10/24/19 1200 123/68 -- -- 80 16 -- --  10/24/19 1155 126/72 -- -- 80 16 -- --  10/24/19 1150 130/74 -- -- 80 16 -- --  10/24/19 1145 121/74 -- -- 91 16 -- --  10/24/19 1103 121/66 -- -- 90 16 -- --  10/24/19 1025 131/68 -- -- 90 18 -- --  10/24/19 0950 138/76 -- -- 80 18 -- --  10/24/19 0840 (!) 110/52 98 F (36.7 C) Oral 71 16 -- --  10/24/19 0720 134/67 -- -- 83 16 -- --  10/24/19 0542 130/84 -- -- 75 -- -- --  10/24/19 0417 134/80 98.1 F (36.7 C) Oral 82 -- -- --  10/24/19 0209 -- 98 F (36.7 C) Oral -- -- -- --  10/24/19 0047 127/78 97.7 F (36.5 C) Oral 72 17 5\' 2"  (1.575 m) 84.1 kg          FHR : baseline 140 bpm / variability moderate / accelerations + 15x15 / occasional early and variable decelerations        Toco: contractions every 4-5 minutes / moderate        Cervix : Dilation: 6 Effacement (%): 60 Cervical Position: Posterior Station: -2 Presentation:  Vertex Exam by:: M Lind Ausley CNM        Membranes: clear fluid  A/P:    Protracted Active labor  - IUPC placed without difficulty  - Continue Pitocin augmentation to achieve adequate MUVs >200 FHR category 1 GBS positive - s/p3doses of IV PCN Gestational HTN - BPs now WNL, no neural s/s or evidence of PEC P/c ratio from office just resulted at 0.59 on 5/6 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 updated with pt status   [phone], MSN, CNM [facility]

## 2019-10-24 NOTE — MAU Note (Signed)
covid swab obtained without difficulty and pt tol well. No symptoms °

## 2019-10-24 NOTE — Progress Notes (Signed)
Pt c/o feeling "out of it", very lethargic, "hard to keep eyes open", alert and oriented x4. Administered of phenylephrine. Will continue to monitor.

## 2019-10-24 NOTE — Progress Notes (Addendum)
S:  Comfortable with epidural. No HA, visual changes, chronic RUQ pain. Discussed AROM with patient and she agrees.   O:  Pitocin at 10 milliunits VS: Blood pressure 120/60, pulse 79, temperature 98.1 F (36.7 C), temperature source Oral, resp. rate 16, height 5\' 2"  (1.575 m), weight 84.1 kg, last menstrual period 02/03/2019, unknown if currently breastfeeding.  Patient Vitals for the past 24 hrs:  BP Temp Temp src Pulse Resp Height Weight  10/24/19 1400 120/60 98.1 F (36.7 C) Oral 79 16 -- --  10/24/19 1330 123/68 -- -- 77 16 -- --  10/24/19 1300 (!) 115/55 -- -- 72 18 -- --  10/24/19 1231 (!) 104/48 -- -- 75 20 -- --  10/24/19 1215 119/69 97.9 F (36.6 C) Oral 85 18 -- --  10/24/19 1210 119/75 -- -- 82 18 -- --  10/24/19 1205 118/71 -- -- 87 16 -- --  10/24/19 1200 123/68 -- -- 80 16 -- --  10/24/19 1155 126/72 -- -- 80 16 -- --  10/24/19 1150 130/74 -- -- 80 16 -- --  10/24/19 1145 121/74 -- -- 91 16 -- --  10/24/19 1103 121/66 -- -- 90 16 -- --  10/24/19 1025 131/68 -- -- 90 18 -- --  10/24/19 0950 138/76 -- -- 80 18 -- --  10/24/19 0840 (!) 110/52 98 F (36.7 C) Oral 71 16 -- --  10/24/19 0720 134/67 -- -- 83 16 -- --  10/24/19 0542 130/84 -- -- 75 -- -- --  10/24/19 0417 134/80 98.1 F (36.7 C) Oral 82 -- -- --  10/24/19 0209 -- 98 F (36.7 C) Oral -- -- -- --  10/24/19 0047 127/78 97.7 F (36.5 C) Oral 72 17 5\' 2"  (1.575 m) 84.1 kg          FHR : baseline 135 bpm / variability moderate / accelerations +15x15 / no decelerations        Toco: contractions every 2-3.61minutes / moderate /        Cervix : Dilation: 6 Effacement (%): 60 Cervical Position: Posterior Station: -3 Presentation: Vertex Exam by:: M Sydelle Sherfield CNM        Membranes: AROM - pink/clear fluid     Bloody show present   A/P: Latent labor     FHR category 1     GBS positive             - s/p 3 doses of IV PCN     Gestational HTN             - BPs now WNL, no neural s/s or evidence of PEC  P/c ratio  from office just resulted at 0.59 on 5/6     Hx. Of PPH x 2             - plan for prophylactic TXA             - active 3rd stage management   Continue rotating positions with peanut    Anticipate NSVD    4m, MSN, CNM [facility]

## 2019-10-24 NOTE — Anesthesia Preprocedure Evaluation (Signed)
Anesthesia Evaluation  Patient identified by MRN, date of birth, ID band Patient awake    Reviewed: Allergy & Precautions, Patient's Chart, lab work & pertinent test results  Airway Mallampati: II  TM Distance: >3 FB Neck ROM: Full    Dental no notable dental hx. (+) Teeth Intact, Dental Advisory Given   Pulmonary neg pulmonary ROS, former smoker,    Pulmonary exam normal breath sounds clear to auscultation       Cardiovascular negative cardio ROS Normal cardiovascular exam Rhythm:Regular Rate:Normal     Neuro/Psych PSYCHIATRIC DISORDERS Depression negative neurological ROS     GI/Hepatic negative GI ROS, Neg liver ROS,   Endo/Other  diabetes, GestationalObesity BMI 34  Renal/GU negative Renal ROS  negative genitourinary   Musculoskeletal negative musculoskeletal ROS (+)   Abdominal Normal abdominal exam  (+)   Peds negative pediatric ROS (+)  Hematology  (+) Blood dyscrasia, anemia , hct 31.6, plt 236   Anesthesia Other Findings   Reproductive/Obstetrics (+) Pregnancy                             Anesthesia Physical Anesthesia Plan  ASA: II and emergent  Anesthesia Plan: Epidural   Post-op Pain Management:    Induction:   PONV Risk Score and Plan: 2  Airway Management Planned: Natural Airway  Additional Equipment: None  Intra-op Plan:   Post-operative Plan:   Informed Consent: I have reviewed the patients History and Physical, chart, labs and discussed the procedure including the risks, benefits and alternatives for the proposed anesthesia with the patient or authorized representative who has indicated his/her understanding and acceptance.       Plan Discussed with:   Anesthesia Plan Comments:         Anesthesia Quick Evaluation

## 2019-10-24 NOTE — Progress Notes (Signed)
S:  Comfortable, resting. Feeling occasional intermittent pressure.   O:  VS: Blood pressure (!) 114/53, pulse 76, temperature 98.1 F (36.7 C), temperature source Oral, resp. rate 16, height 5\' 2"  (1.575 m), weight 84.1 kg, last menstrual period 02/03/2019, unknown if currently breastfeeding.  Patient Vitals for the past 24 hrs:  BP Temp Temp src Pulse Resp Height Weight  10/24/19 1600 (!) 114/53 -- -- 76 16 -- --  10/24/19 1530 (!) 99/43 -- -- 71 18 -- --  10/24/19 1500 (!) 104/46 -- -- 75 18 -- --  10/24/19 1430 (!) 104/44 -- -- 73 16 -- --  10/24/19 1400 120/60 98.1 F (36.7 C) Oral 79 16 -- --  10/24/19 1330 123/68 -- -- 77 16 -- --  10/24/19 1300 (!) 115/55 -- -- 72 18 -- --  10/24/19 1231 (!) 104/48 -- -- 75 20 -- --  10/24/19 1215 119/69 97.9 F (36.6 C) Oral 85 18 -- --  10/24/19 1210 119/75 -- -- 82 18 -- --  10/24/19 1205 118/71 -- -- 87 16 -- --  10/24/19 1200 123/68 -- -- 80 16 -- --  10/24/19 1155 126/72 -- -- 80 16 -- --  10/24/19 1150 130/74 -- -- 80 16 -- --  10/24/19 1145 121/74 -- -- 91 16 -- --  10/24/19 1103 121/66 -- -- 90 16 -- --  10/24/19 1025 131/68 -- -- 90 18 -- --  10/24/19 0950 138/76 -- -- 80 18 -- --  10/24/19 0840 (!) 110/52 98 F (36.7 C) Oral 71 16 -- --  10/24/19 0720 134/67 -- -- 83 16 -- --  10/24/19 0542 130/84 -- -- 75 -- -- --  10/24/19 0417 134/80 98.1 F (36.7 C) Oral 82 -- -- --  10/24/19 0209 -- 98 F (36.7 C) Oral -- -- -- --  10/24/19 0047 127/78 97.7 F (36.5 C) Oral 72 17 5\' 2"  (1.575 m) 84.1 kg          FHR : baseline 135 bpm / variability moderate / accelerations +15x15 / no decelerations        Toco: contractions every 5 minutes / moderate         Cervix : deferred        Membranes: AROM clear/pink fluid    A/P:Active labor FHR category 1 GBS positive - s/p 3 doses of IV PCN Gestational HTN - BPs now WNL, no neural s/s or evidence of PEC             P/c ratio from office just  resulted at 0.59 on 5/6 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

## 2019-10-24 NOTE — Anesthesia Procedure Notes (Signed)
Epidural Patient location during procedure: OB Start time: 10/24/2019 11:36 AM End time: 10/24/2019 11:46 AM  Staffing Anesthesiologist: Lannie Fields, DO Performed: anesthesiologist   Preanesthetic Checklist Completed: patient identified, IV checked, risks and benefits discussed, monitors and equipment checked, pre-op evaluation and timeout performed  Epidural Patient position: sitting Prep: DuraPrep and site prepped and draped Patient monitoring: continuous pulse ox, blood pressure, heart rate and cardiac monitor Approach: midline Location: L3-L4 Injection technique: LOR air  Needle:  Needle type: Tuohy  Needle gauge: 17 G Needle length: 9 cm Needle insertion depth: 5 cm Catheter type: closed end flexible Catheter size: 19 Gauge Catheter at skin depth: 10 cm Test dose: negative  Assessment Sensory level: T8 Events: blood not aspirated, injection not painful, no injection resistance, no paresthesia and negative IV test  Additional Notes Patient identified. Risks/Benefits/Options discussed with patient including but not limited to bleeding, infection, nerve damage, paralysis, failed block, incomplete pain control, headache, blood pressure changes, nausea, vomiting, reactions to medication both or allergic, itching and postpartum back pain. Confirmed with bedside nurse the patient's most recent platelet count. Confirmed with patient that they are not currently taking any anticoagulation, have any bleeding history or any family history of bleeding disorders. Patient expressed understanding and wished to proceed. All questions were answered. Sterile technique was used throughout the entire procedure. Please see nursing notes for vital signs. Test dose was given through epidural catheter and negative prior to continuing to dose epidural or start infusion. Warning signs of high block given to the patient including shortness of breath, tingling/numbness in hands, complete motor  block, or any concerning symptoms with instructions to call for help. Patient was given instructions on fall risk and not to get out of bed. All questions and concerns addressed with instructions to call with any issues or inadequate analgesia.  Reason for block:procedure for pain

## 2019-10-24 NOTE — Progress Notes (Signed)
S:  Comfortable, was able to rest with Stadol. Denies HA, visual changes, reports chronic RUQ pain.   O:  VS: Blood pressure (!) 110/52, pulse 71, temperature 98 F (36.7 C), temperature source Oral, resp. rate 16, height 5\' 2"  (1.575 m), weight 84.1 kg, last menstrual period 02/03/2019, unknown if currently breastfeeding.  Patient Vitals for the past 24 hrs:  BP Temp Temp src Pulse Resp Height Weight  10/24/19 0840 (!) 110/52 98 F (36.7 C) Oral 71 16 -- --  10/24/19 0720 134/67 -- -- 83 16 -- --  10/24/19 0542 130/84 -- -- 75 -- -- --  10/24/19 0417 134/80 98.1 F (36.7 C) Oral 82 -- -- --  10/24/19 0209 -- 98 F (36.7 C) Oral -- -- -- --  10/24/19 0047 127/78 97.7 F (36.5 C) Oral 72 17 5\' 2"  (1.575 m) 84.1 kg          FHR : baseline 135 bpm / variability moderate / accelerations +15x15 / no decelerations        Toco: contractions every 3-4 minutes / mild         Cervix : Dilation: 1.5 Effacement (%): 40 Cervical Position: Posterior Station: -3 Presentation: Vertex Exam by:: CNM        Membranes: Intact  A/P: Latent labor     FHR category 1     GBS positive  - s/p 2 doses of IV PCN     Gestational HTN  - BPs now WNL, no neural s/s or evidence of PEC     Hx. Of PPH x 2  - plan for prophylactic TXA   - active 3rd stage management      Intracervical balloon placed     May eat light laboring diet     Stadol 1mg  every 2hrs PRN     Epidural upon request     Begin Pitocin 71mu x96mu after meal    Anticipated MOD: SVD     , MSN, CNM [facility]

## 2019-10-25 LAB — CBC
HCT: 29 % — ABNORMAL LOW (ref 36.0–46.0)
Hemoglobin: 9.2 g/dL — ABNORMAL LOW (ref 12.0–15.0)
MCH: 27.3 pg (ref 26.0–34.0)
MCHC: 31.7 g/dL (ref 30.0–36.0)
MCV: 86.1 fL (ref 80.0–100.0)
Platelets: 203 10*3/uL (ref 150–400)
RBC: 3.37 MIL/uL — ABNORMAL LOW (ref 3.87–5.11)
RDW: 16.3 % — ABNORMAL HIGH (ref 11.5–15.5)
WBC: 16.2 10*3/uL — ABNORMAL HIGH (ref 4.0–10.5)
nRBC: 0 % (ref 0.0–0.2)

## 2019-10-25 MED ORDER — POLYSACCHARIDE IRON COMPLEX 150 MG PO CAPS
150.0000 mg | ORAL_CAPSULE | Freq: Every day | ORAL | Status: DC
  Administered 2019-10-26: 150 mg via ORAL
  Filled 2019-10-25: qty 1

## 2019-10-25 MED ORDER — MAGNESIUM OXIDE 400 (241.3 MG) MG PO TABS
400.0000 mg | ORAL_TABLET | Freq: Two times a day (BID) | ORAL | Status: DC
  Administered 2019-10-25 – 2019-10-26 (×2): 400 mg via ORAL
  Filled 2019-10-25 (×2): qty 1

## 2019-10-25 NOTE — Progress Notes (Signed)
CSW received consult due to score 11 on Edinburgh Depression Screen.    CSW congratulated MOB and FOB on the birth of infant. CSW advised MOB of HIPPA policy in which MOB was agreeable to having FOB remain in the room. MOB was advised of CSW's role and the reason for CSW coming to visit with her. 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 MOB reported that she was on medications and in therapy. MOB reported that she was on Buspar, "however that didn't make a difference for me, so I stopped talking it". CSW understanding of this and asked MOB if she felt that she needed medication management resources, in which MOB Declined at this time. MOB reported that she was seeing a therapist while in [place] however is not seeing one at this time and declined the need for resources. MOB denies any other mental health diagnosis. MOB denies SI and HI and reported that she was feeling much better since she gave birth. MOB denies SI and HI and reported that she has all needed items to care for infant with support form her spouse and church family.   CSW provided education regarding Baby Blues vs PMADs and provided MOB with resources for mental health follow up.  CSW encouraged MOB to evaluate her mental health throughout the postpartum period with the use of the New Mom Checklist developed by [facility] as well as the Edinburgh Postnatal Depression Scale and notify a medical professional if symptoms arise.    MOB and FOB were both given SIDS education with no other questions.     Isabele Lollar S. Zelma Mazariego, MSW, LCSW [facility] [phone]   

## 2019-10-25 NOTE — Lactation Note (Signed)
This note was copied from a baby's chart. Lactation Consultation Note Attempted to see mom. Room dark, mom sleeping soundly.  Patient Name: Amber Daniel [id number] Date: 10/25/2019     Maternal Data    Feeding    LATCH Score                   Interventions    Lactation Tools Discussed/Used     Consult Status      Amber Daniel 10/25/2019, 3:18 AM

## 2019-10-25 NOTE — Lactation Note (Signed)
This note was copied from a baby's chart. Lactation Consultation Note  Patient Name: Amber Daniel [id number] Date: 10/25/2019 Reason for consult: Initial assessment;Early term 37-38.6wks P3.  Mom breastfed her previous babies for 18 months.  She reports newborn has been latching easily but nipples are tender.  Baby has been sleepy today and showing little interest in feeding.  Mom has been doing skin to skin and using waking techniques.  Instructed to feed with cues and attempt latch every 2-3 hours.  Encouraged to call for assist prn.  Breastfeeding consultation services information given and reviewed.  Maternal Data Does the patient have breastfeeding experience prior to this delivery?: Yes  Feeding    LATCH Score                   Interventions    Lactation Tools Discussed/Used     Consult Status Consult Status: Follow-up Date: 10/26/19 Follow-up type: In-patient    Huston Foley 10/25/2019, 2:20 PM

## 2019-10-25 NOTE — Progress Notes (Signed)
Post Partum Day 1 S/P induced vaginal  Feeding: breast Subjective: No HA, SOB, CP, F/C, breast symptoms. Normal vaginal bleeding, no clots. Pain controlled.  ambulating without symptoms.  voiding without difficulty    Induction of labor for gestational hypertension though normal BPs since in-house.  History of postpartum hemorrhage with stable postpartum bleeding  Objective: BP 123/75 (BP Location: Right Arm)   Pulse 71   Temp 97.8 F (36.6 C) (Oral)   Resp 20   Ht 5\' 2"  (1.575 m)   Wt 84.1 kg   LMP 02/03/2019   Breastfeeding Unknown   BMI 33.89 kg/m  I&O reviewed.   Physical Exam:  General: alert and cooperative Lochia: appropriate Uterine Fundus: firm DVT Evaluation: No evidence of DVT seen on physical exam. Ext: No c/c/e Recent Labs    10/24/19 2100 10/25/19 0544  HGB 10.0* 9.2*  HCT 32.6* 29.0*      Assessment/Plan: 24 y.o.  PPD #1 .  normal postpartum exam Continue current postpartum care Ambulate Watch blood pressures closely.  Not on any meds currently Acute blood loss anemia.  Start iron and magnesium prophylaxis   LOS: 1 day   22 10/25/2019 11:20 AM

## 2019-10-25 NOTE — Anesthesia Postprocedure Evaluation (Signed)
Anesthesia Post Note  Patient: Amber Daniel  Procedure(s) Performed: AN AD HOC LABOR EPIDURAL     Patient location during evaluation: Mother Baby Anesthesia Type: Epidural Level of consciousness: awake and alert and oriented Pain management: satisfactory to patient Vital Signs Assessment: post-procedure vital signs reviewed and stable Respiratory status: respiratory function stable Cardiovascular status: stable Postop Assessment: no headache, no backache, epidural receding, patient able to bend at knees, no signs of nausea or vomiting, adequate PO intake, no apparent nausea or vomiting and able to ambulate Anesthetic complications: no    Last Vitals:  Vitals:   10/24/19 2328 10/25/19 0300  BP: 140/64 (!) 141/86  Pulse: 85 71  Resp: 18   Temp: 37.1 C 36.5 C    Last Pain:  Vitals:   10/25/19 0307  TempSrc:   PainSc: 4    Pain Goal:                   [name]

## 2019-10-26 MED ORDER — IBUPROFEN 600 MG PO TABS: 600.0000 mg | ORAL_TABLET | Freq: Four times a day (QID) | ORAL | 0 refills | Status: DC

## 2019-10-26 NOTE — Lactation Note (Signed)
This note was copied from a baby's chart. Lactation Consultation Note  Patient Name: Amber Daniel [id number] Date: 10/26/2019 Reason for consult: Follow-up assessment   LC Follow Up Visit:  P4 mother whose infant is now 67 hours old.  This is an ETI at 37+4 weeks with a 9% weight loss.  Mother breast fed her other children for 18 months each.  In reviewing the flowsheets, baby has voided 15 times and stooled 7 times since birth.  Mother stated that she went a long interval without feeding at one interval but is latching and feeding well now.    Due to weight loss and early gestational age, I suggested mother hand express and/or pump and feed back any EBM she obtains to baby until weight loss stabilizes.  Mother has not been given a manual pump.  Provided a manual pump with instructions for use and observed mother pumping EBM easily.  #24 flange size is appropriate at this time.  Provided a slow flow nipple and a curved tip syringe (with demonstration) and parents can use either device to feed back any EBM mother obtains.  Stressed the importance of feeding at least every three hours and more often if baby desires.  Mother will post pump for 15 minutes after feedings and provide her EBM.  Informed mother that if she is not discharged today I would be happy to return to set up the DEBP for her to use.    Mother is familiar with engorgement prevention/treatment.  She has a DEBP for home use.  Mother also has our OP phone number for any questions/concerns after discharge.  Family desires a discharge today.  Father present.  RN updated.                      Consult Status Consult Status: Complete Date: 10/26/19 Follow-up type: In-patient    Juliah Scadden R Nikolai Wilczak 10/26/2019, 9:17 AM

## 2019-10-26 NOTE — Discharge Summary (Signed)
Obstetric Discharge Summary Reason for Admission: induction of labor Prenatal Procedures: none Intrapartum Procedures: spontaneous vaginal delivery Postpartum Procedures: none Complications-Operative and Postpartum: none Hemoglobin  Date Value Ref Range Status  10/25/2019 9.2 (L) 12.0 - 15.0 g/dL Final   HCT  Date Value Ref Range Status  10/25/2019 29.0 (L) 36.0 - 46.0 % Final    Physical Exam:  General: alert, cooperative and no distress Lochia: appropriate Uterine Fundus: firm Incision: n/a DVT Evaluation: No evidence of DVT seen on physical exam.  Discharge Diagnoses: Term Pregnancy-delivered  Discharge Information: Date: 10/26/2019 Activity: pelvic rest Diet: routine Medications: Ibuprofen and Iron Condition: stable Instructions: refer to practice specific booklet Discharge to: home   Newborn Data: Live born female  Birth Weight: 7 lb 15.7 oz (3620 g) APGAR: 8, 9  Newborn Delivery   Birth date/time: 10/24/2019 20:13:00 Delivery type: Vaginal, Spontaneous    f/u in 1 wk for bp check  Home with mother.  Vick Frees 10/26/2019, 11:59 AM

## 2019-10-26 NOTE — Progress Notes (Signed)
No c/o; normal lochia, minimal pain/cramping, no lightheadedness/dizziness; voiding w/o difficulty; tol po Trying to breastfeed   Patient Vitals for the past 24 hrs:  BP Temp Temp src Pulse Resp  10/26/19 0513 137/83 97.9 F (36.6 C) Oral 65 18  10/25/19 2108 129/74 97.6 F (36.4 C) Oral 78 18  10/25/19 1442 (!) 119/54 -- -- 70 18   A&ox3 rrr ctab Abd: soft, nt, nd; fundus firm and below umb LE: tr edema, nt bilat  CBC Latest Ref Rng & Units 10/25/2019 10/24/2019 10/24/2019  WBC 4.0 - 10.5 K/uL 16.2(H) 17.7(H) 12.8(H)  Hemoglobin 12.0 - 15.0 g/dL 1.5(F) 10.0(L) 10.1(L)  Hematocrit 36.0 - 46.0 % 29.0(L) 32.6(L) 31.6(L)  Platelets 150 - 400 K/uL 203 212 236   A/P: ppd2 s/p svd 1. Doing well, d/c home  2. GHTN-, Borderline bp now with mostly wnl - plan f/u bp check in 1 wk 3. Chronic anemia with acute change - iron q day, asymptomatic 4. Rh Pos 5. RI

## 2019-10-27 ENCOUNTER — Ambulatory Visit: Payer: Self-pay

## 2019-10-27 NOTE — Lactation Note (Signed)
This note was copied from a baby's chart. Lactation Consultation Note  Patient Name: Girl Taimi Towe [id number] Date: 10/27/2019 Reason for consult: Follow-up assessment;Infant weight loss   Baby 61 hours old.  Ex BD. 9.7% weight loss which has now stabilized. Frequent voids and yellow stools. Baby recently bf on R breast and was swaddled.  Suggest breastfeeding on both breasts per feeding and to unwrap baby to wake. Mother hand expressed good flow and then latched baby on L breast with ease and frequent swallows. Reviewed engorgement care and monitoring voids/stools. Mother knows to post pump and give volume back to baby.  She pumped x 2 yesterday. She has DEBP at home.    Maternal Data Has patient been taught Hand Expression?: Yes  Feeding Feeding Type: Breast Fed  LATCH Score Latch: Grasps breast easily, tongue down, lips flanged, rhythmical sucking.  Audible Swallowing: Spontaneous and intermittent  Type of Nipple: Everted at rest and after stimulation  Comfort (Breast/Nipple): Soft / non-tender  Hold (Positioning): Assistance needed to correctly position infant at breast and maintain latch.  LATCH Score: 9  Interventions Interventions: Breast feeding basics reviewed;Hand express;Hand pump  Lactation Tools Discussed/Used     Consult Status Consult Status: Complete Date: 10/27/19    Dahlia Byes [facility] 10/27/2019, 9:20 AM

## 2019-10-27 NOTE — Lactation Note (Signed)
This note was copied from a baby's chart. Lactation Consultation Note Baby 103 hrs old. LC attempted to see mom. Room dark. Everyone in room sleeping. LC wanted to see mom d/t 10% wt. Loss. Had 9% yesterday. Has great output. LC will see if mom would like to supplement w/EBM. Mom requested LC earlier this shift, LC busy w/other pt.  Patient Name: Amber Daniel [id number] Date: 10/27/2019     Maternal Data    Feeding    LATCH Score                   Interventions    Lactation Tools Discussed/Used     Consult Status      Amber Daniel 10/27/2019, 4:50 AM

## 2019-11-07 ENCOUNTER — Encounter (HOSPITAL_COMMUNITY): Payer: Self-pay | Admitting: Obstetrics and Gynecology

## 2019-11-07 ENCOUNTER — Other Ambulatory Visit: Payer: Self-pay

## 2019-11-07 ENCOUNTER — Inpatient Hospital Stay (HOSPITAL_COMMUNITY)
Admission: AD | Admit: 2019-11-07 | Discharge: 2019-11-07 | Disposition: A | Discharge status: Home / Self Care | Payer: BLUE CROSS/BLUE SHIELD | Attending: Obstetrics and Gynecology | Admitting: Obstetrics and Gynecology

## 2019-11-07 ENCOUNTER — Ambulatory Visit
Admission: RE | Admit: 2019-11-07 | Discharge: 2019-11-07 | Disposition: A | Discharge status: Home / Self Care | Payer: No Typology Code available for payment source | Source: Ambulatory Visit | Attending: Gastroenterology | Admitting: Gastroenterology

## 2019-11-07 DIAGNOSIS — R1900 Intra-abdominal and pelvic swelling, mass and lump, unspecified site: Secondary | ICD-10-CM

## 2019-11-07 DIAGNOSIS — Z8632 Personal history of gestational diabetes: Secondary | ICD-10-CM | POA: Diagnosis not present

## 2019-11-07 DIAGNOSIS — O165 Unspecified maternal hypertension, complicating the puerperium: Secondary | ICD-10-CM | POA: Insufficient documentation

## 2019-11-07 DIAGNOSIS — E119 Type 2 diabetes mellitus without complications: Secondary | ICD-10-CM | POA: Insufficient documentation

## 2019-11-07 DIAGNOSIS — Z87891 Personal history of nicotine dependence: Secondary | ICD-10-CM | POA: Insufficient documentation

## 2019-11-07 DIAGNOSIS — Z79899 Other long term (current) drug therapy: Secondary | ICD-10-CM | POA: Insufficient documentation

## 2019-11-07 DIAGNOSIS — Z8249 Family history of ischemic heart disease and other diseases of the circulatory system: Secondary | ICD-10-CM | POA: Insufficient documentation

## 2019-11-07 DIAGNOSIS — Z791 Long term (current) use of non-steroidal anti-inflammatories (NSAID): Secondary | ICD-10-CM | POA: Insufficient documentation

## 2019-11-07 HISTORY — DX: Headache, unspecified: R51.9

## 2019-11-07 HISTORY — DX: Unspecified abnormal cytological findings in specimens from vagina: R87.629

## 2019-11-07 HISTORY — DX: Gestational (pregnancy-induced) hypertension without significant proteinuria, unspecified trimester: O13.9

## 2019-11-07 LAB — COMPREHENSIVE METABOLIC PANEL
ALT: 42 U/L (ref 0–44)
AST: 41 U/L (ref 15–41)
Albumin: 3.2 g/dL — ABNORMAL LOW (ref 3.5–5.0)
Alkaline Phosphatase: 90 U/L (ref 38–126)
Anion gap: 10 (ref 5–15)
BUN: 23 mg/dL — ABNORMAL HIGH (ref 6–20)
CO2: 21 mmol/L — ABNORMAL LOW (ref 22–32)
Calcium: 9.2 mg/dL (ref 8.9–10.3)
Chloride: 108 mmol/L (ref 98–111)
Creatinine, Ser: 0.71 mg/dL (ref 0.44–1.00)
GFR calc Af Amer: >60 mL/min (ref >60)
GFR calc non Af Amer: >60 mL/min (ref >60)
Glucose, Bld: 102 mg/dL — ABNORMAL HIGH (ref 70–99)
Potassium: 3.9 mmol/L (ref 3.5–5.1)
Sodium: 139 mmol/L (ref 135–145)
Total Bilirubin: 0.4 mg/dL (ref 0.3–1.2)
Total Protein: 6.1 g/dL — ABNORMAL LOW (ref 6.5–8.1)

## 2019-11-07 LAB — CBC
HCT: 32.9 % — ABNORMAL LOW (ref 36.0–46.0)
Hemoglobin: 10.1 g/dL — ABNORMAL LOW (ref 12.0–15.0)
MCH: 26.4 pg (ref 26.0–34.0)
MCHC: 30.7 g/dL (ref 30.0–36.0)
MCV: 85.9 fL (ref 80.0–100.0)
Platelets: 351 10*3/uL (ref 150–400)
RBC: 3.83 MIL/uL — ABNORMAL LOW (ref 3.87–5.11)
RDW: 16.5 % — ABNORMAL HIGH (ref 11.5–15.5)
WBC: 7.4 10*3/uL (ref 4.0–10.5)
nRBC: 0 % (ref 0.0–0.2)

## 2019-11-07 LAB — PROTEIN / CREATININE RATIO, URINE
Creatinine, Urine: 126.95 mg/dL
Protein Creatinine Ratio: 0.08 mg/mg{Cre} (ref 0.00–0.15)
Total Protein, Urine: 10 mg/dL

## 2019-11-07 MED ORDER — ENALAPRIL MALEATE 10 MG PO TABS: 10.0000 mg | ORAL_TABLET | Freq: Every day | ORAL | 0 refills | Status: AC

## 2019-11-07 MED ORDER — ENALAPRIL MALEATE 5 MG PO TABS
10.0000 mg | ORAL_TABLET | Freq: Once | ORAL | Status: AC
  Administered 2019-11-07: 10 mg via ORAL
  Filled 2019-11-07: qty 2

## 2019-11-07 NOTE — Discharge Instructions (Signed)
Start Enalapril 10 mg PO daily Call and have a BP check in the office on Monday.

## 2019-11-07 NOTE — MAU Provider Note (Signed)
History     CSN: [id number]  Arrival date and time: 11/07/19 1805   First Provider Initiated Contact with Patient 11/07/19 1905      Chief Complaint  Patient presents with  . Hypertension   HPI Amber Daniel 24 y.o. Postpartum for 2 weeks.  The office called today and had her check her BP and it was 150/108.  Advised her to come in for evaluation.  Has a family history of Preeclampsia postpartum.  Was surprised by this blood pressure.  Has not been feeling bad.  OB History    Gravida  4   Para  4   Term  3   Preterm  0   AB  0   Living  4     SAB  0   TAB  0   Ectopic  0   Multiple  0   Live Births  4           Past Medical History:  Diagnosis Date  . Anemia   . Depression    major in high school  . Diabetes mellitus without complication ([place])   . Gestational diabetes    with #3  . Headache   . Pregnancy induced hypertension    #4  . Vaginal Pap smear, abnormal    mod/severe cervical dysplasia, ok since    Past Surgical History:  Procedure Laterality Date  . NO PAST SURGERIES      Family History  Problem Relation Age of Onset  . Diabetes Father   . Hypertension Father   . Hypertension Paternal Grandfather   . Heart disease Paternal Grandfather   . Healthy Mother     Social History   Tobacco Use  . Smoking status: Former Smoker    Types: Cigarettes  . Smokeless tobacco: Never Used  Substance Use Topics  . Alcohol use: Not Currently    Comment: not in pregnancy  . Drug use: Not Currently    Types: Marijuana    Comment: not since 24 years old    Allergies: No Active Allergies  Medications Prior to Admission  Medication Sig Dispense Refill Last Dose  . acetaminophen (TYLENOL) 325 MG tablet Take 650 mg by mouth every 6 (six) hours as needed for mild pain or headache.   Past Month at Unknown time  . ferrous sulfate 325 (65 FE) MG tablet Take 650 mg by mouth daily with breakfast.    Past Month at Unknown time  . ibuprofen  (ADVIL) 600 MG tablet Take 1 tablet (600 mg total) by mouth every 6 (six) hours. 30 tablet 0 11/07/2019 at Unknown time  . Prenatal Vit-Fe Fumarate-FA (PRENATAL MULTIVITAMIN) TABS tablet Take 2 tablets by mouth daily at 12 noon.    Past Month at Unknown time    Review of Systems  Constitutional: Negative for fever.  Respiratory: Negative for shortness of breath and stridor.   Gastrointestinal: Negative for abdominal pain.  Genitourinary: Negative for dysuria.  Neurological: Negative for headaches.   Physical Exam   Blood pressure (!) 148/88, pulse 67, temperature 98.5 F (36.9 C), temperature source Oral, resp. rate 18, last menstrual period 02/03/2019, SpO2 100 %, currently breastfeeding.  Physical Exam  Nursing note and vitals reviewed. Constitutional: She is oriented to person, place, and time. She appears well-developed and well-nourished.  HENT:  Head: Normocephalic.  Eyes: EOM are normal.  GI: Soft. There is no abdominal tenderness.  Minimal RUQ pain but client also has a mass on her liver and is  accustomed to having some pain in that area.  Had an abdominal ultrasound today.  Musculoskeletal:        General: Normal range of motion.     Cervical back: Neck supple.  Neurological: She is alert and oriented to person, place, and time.  Skin: Skin is warm and dry.  Psychiatric: She has a normal mood and affect.    MAU Course  Procedures Labs Results for orders placed or performed during the hospital encounter of 11/07/19 (from the past 24 hour(s))  Protein / creatinine ratio, urine     Status: None   Collection Time: 11/07/19  7:15 PM  Result Value Ref Range   Creatinine, Urine 126.95 mg/dL   Total Protein, Urine 10 mg/dL   Protein Creatinine Ratio 0.08 0.00 - 0.15 mg/mg[Cre]  CBC     Status: Abnormal   Collection Time: 11/07/19  7:21 PM  Result Value Ref Range   WBC 7.4 4.0 - 10.5 K/uL   RBC 3.83 (L) 3.87 - 5.11 MIL/uL   Hemoglobin 10.1 (L) 12.0 - 15.0 g/dL   HCT  63.0 (L) 16.0 - 46.0 %   MCV 85.9 80.0 - 100.0 fL   MCH 26.4 26.0 - 34.0 pg   MCHC 30.7 30.0 - 36.0 g/dL   RDW 10.9 (H) 32.3 - 55.7 %   Platelets 351 150 - 400 K/uL   nRBC 0.0 0.0 - 0.2 %  Comprehensive metabolic panel     Status: Abnormal   Collection Time: 11/07/19  7:21 PM  Result Value Ref Range   Sodium 139 135 - 145 mmol/L   Potassium 3.9 3.5 - 5.1 mmol/L   Chloride 108 98 - 111 mmol/L   CO2 21 (L) 22 - 32 mmol/L   Glucose, Bld 102 (H) 70 - 99 mg/dL   BUN 23 (H) 6 - 20 mg/dL   Creatinine, Ser 3.22 0.44 - 1.00 mg/dL   Calcium 9.2 8.9 - 02.5 mg/dL   Total Protein 6.1 (L) 6.5 - 8.1 g/dL   Albumin 3.2 (L) 3.5 - 5.0 g/dL   AST 41 15 - 41 U/L   ALT 42 0 - 44 U/L   Alkaline Phosphatase 90 38 - 126 U/L   Total Bilirubin 0.4 0.3 - 1.2 mg/dL   GFR calc non Af Amer >60 >60 mL/min   GFR calc Af Amer >60 >60 mL/min   Anion gap 10 5 - 15    MDM Reviewed labs with Dr. Macon Large and discussed plan of care.  Assessment and Plan  Postpartum hypertension  Plan Start Enalapril 10 mg PO daily - one dose given in MAU and client will pick up prescription tomorrow. Call and have a BP check in the office on Monday. Return with headache or visual changes.  Amber Daniel 11/07/2019, 7:11 PM

## 2019-11-07 NOTE — MAU Note (Signed)
BP was high.  Was induced because of BP. Vag delivery 5/6. Denies HA, visual changes or epigastric pain, no increase in swelling.

## 2019-11-11 ENCOUNTER — Other Ambulatory Visit: Payer: Self-pay | Admitting: Gastroenterology

## 2019-11-11 DIAGNOSIS — R935 Abnormal findings on diagnostic imaging of other abdominal regions, including retroperitoneum: Secondary | ICD-10-CM

## 2020-01-16 ENCOUNTER — Ambulatory Visit
Admission: RE | Admit: 2020-01-16 | Discharge: 2020-01-16 | Disposition: A | Discharge status: Home / Self Care | Payer: No Typology Code available for payment source | Source: Ambulatory Visit | Attending: Gastroenterology | Admitting: Gastroenterology

## 2020-01-16 ENCOUNTER — Other Ambulatory Visit: Payer: Self-pay

## 2020-01-16 DIAGNOSIS — R935 Abnormal findings on diagnostic imaging of other abdominal regions, including retroperitoneum: Secondary | ICD-10-CM

## 2020-01-16 MED ORDER — GADOBENATE DIMEGLUMINE 529 MG/ML IV SOLN
15.0000 mL | Freq: Once | INTRAVENOUS | Status: AC | PRN
  Administered 2020-01-16: 15 mL via INTRAVENOUS

## 2020-01-24 ENCOUNTER — Other Ambulatory Visit: Payer: Self-pay

## 2020-08-22 ENCOUNTER — Emergency Department (HOSPITAL_BASED_OUTPATIENT_CLINIC_OR_DEPARTMENT_OTHER): Payer: Self-pay

## 2020-08-22 ENCOUNTER — Other Ambulatory Visit: Payer: Self-pay

## 2020-08-22 ENCOUNTER — Emergency Department (HOSPITAL_BASED_OUTPATIENT_CLINIC_OR_DEPARTMENT_OTHER)
Admission: EM | Admit: 2020-08-22 | Discharge: 2020-08-22 | Disposition: A | Discharge status: Home / Self Care | Payer: Self-pay | Attending: Emergency Medicine | Admitting: Emergency Medicine

## 2020-08-22 ENCOUNTER — Encounter (HOSPITAL_BASED_OUTPATIENT_CLINIC_OR_DEPARTMENT_OTHER): Payer: Self-pay

## 2020-08-22 DIAGNOSIS — Z87891 Personal history of nicotine dependence: Secondary | ICD-10-CM | POA: Insufficient documentation

## 2020-08-22 DIAGNOSIS — E119 Type 2 diabetes mellitus without complications: Secondary | ICD-10-CM | POA: Insufficient documentation

## 2020-08-22 DIAGNOSIS — X58XXXA Exposure to other specified factors, initial encounter: Secondary | ICD-10-CM | POA: Insufficient documentation

## 2020-08-22 DIAGNOSIS — Y9384 Activity, sleeping: Secondary | ICD-10-CM | POA: Insufficient documentation

## 2020-08-22 DIAGNOSIS — S76011A Strain of muscle, fascia and tendon of right hip, initial encounter: Secondary | ICD-10-CM | POA: Insufficient documentation

## 2020-08-22 DIAGNOSIS — R109 Unspecified abdominal pain: Secondary | ICD-10-CM | POA: Insufficient documentation

## 2020-08-22 DIAGNOSIS — M533 Sacrococcygeal disorders, not elsewhere classified: Secondary | ICD-10-CM | POA: Insufficient documentation

## 2020-08-22 HISTORY — DX: Unspecified pre-eclampsia, unspecified trimester: O14.90

## 2020-08-22 LAB — PREGNANCY, URINE: Preg Test, Ur: NEGATIVE

## 2020-08-22 NOTE — ED Triage Notes (Signed)
She tells me that she c/o non-traumatic right hip pain since yesterday afternoon. She further tells me that she has had "real bad tail bone pain ever since I delivered a baby 10 months ago, I wonder if it has something to do with that". She further tells me that "they had to induce me a little early because of my (high) blood pressure". She is slowly ambulatory.

## 2020-08-22 NOTE — ED Notes (Signed)
CMS intact bilat. Feet and all toes.

## 2020-08-22 NOTE — ED Provider Notes (Signed)
[facility] EMERGENCY DEPARTMENT Provider Note   CSN: [id number] Arrival date & time: 08/22/20  1010     History No chief complaint on file.   Amber Daniel is a 25 y.o. female who presents with concern for 1 day of right hip/groin pain, worse with movement.  Patient denies any trauma, or new activities or exercises.  She states that she was lying on the couch watching TV yesterday when she stood up she had "excruciating pain" along the fold of her leg and her hip.  She states pain is quite mild when she is lying still, but becomes severe when she moves.  She is ambulatory, but endorses severe pain in her groin.  Pain improved with application of heating pad, minimal improvement with ibuprofen.  Additionally patient endorses history of "severe tailbone pain that does not let me sit" since the birth of her youngest child approximately 10 months ago.  She states that she had this evaluated by her OB/GYN who referred her to physical therapy, however her health insurance lapsed and so she has not gone back.  Pain improved with sitting on a donut pillow.  I personally read the patient medical records.  She is history of gestational diabetes and gestational hypertension, history of depression.  She is not on any medications every day.   HPI     Past Medical History:  Diagnosis Date  . Anemia   . Depression    major in high school  . Diabetes mellitus without complication ([facility])   . Gestational diabetes    with #3  . Headache   . Pre-eclampsia   . Pregnancy induced hypertension    #4  . Vaginal Pap smear, abnormal    mod/severe cervical dysplasia, ok since    Patient Active Problem List   Diagnosis Date Noted  . Encounter for induction of labor 10/24/2019  . Gestational hypertension 10/24/2019  . SVD (spontaneous vaginal delivery) 04/24/2018  . Postpartum care following vaginal delivery (5/6) 04/24/2018    Past Surgical History:  Procedure Laterality Date  . NO  PAST SURGERIES       OB History    Gravida  4   Para  4   Term  3   Preterm  0   AB  0   Living  4     SAB  0   IAB  0   Ectopic  0   Multiple  0   Live Births  4           Family History  Problem Relation Age of Onset  . Diabetes Father   . Hypertension Father   . Hypertension Paternal Grandfather   . Heart disease Paternal Grandfather   . Healthy Mother     Social History   Tobacco Use  . Smoking status: Former Smoker    Types: Cigarettes  . Smokeless tobacco: Never Used  Vaping Use  . Vaping Use: Never used  Substance Use Topics  . Alcohol use: Not Currently    Comment: not in pregnancy  . Drug use: Not Currently    Types: Marijuana    Comment: not since 25 years old    Home Medications Prior to Admission medications   Medication Sig Start Date End Date Taking? Authorizing Provider  acetaminophen (TYLENOL) 325 MG tablet Take 650 mg by mouth every 6 (six) hours as needed for mild pain or headache.    [provider]  enalapril (VASOTEC) 10 MG tablet Take 1 tablet (  10 mg total) by mouth daily. 11/07/19 12/07/19  Currie Paris, NP  ferrous sulfate 325 (65 FE) MG tablet Take 650 mg by mouth daily with breakfast.     [provider]  ibuprofen (ADVIL) 600 MG tablet Take 1 tablet (600 mg total) by mouth every 6 (six) hours. 10/26/19   Vick Frees, MD  Prenatal Vit-Fe Fumarate-FA (PRENATAL MULTIVITAMIN) TABS tablet Take 2 tablets by mouth daily at 12 noon.     [provider]    Allergies    Patient has no active allergies.  Review of Systems   Review of Systems  Constitutional: Negative.   HENT: Negative.   Respiratory: Negative.   Cardiovascular: Negative.   Gastrointestinal: Negative.   Genitourinary: Negative.   Musculoskeletal: Positive for back pain and myalgias.       Tailbone pain with sitting, right hip/groin pain with movement.  Skin: Negative.   Neurological: Negative.   Hematological: Negative.    Psychiatric/Behavioral: Negative.     Physical Exam Updated Vital Signs BP 132/79 (BP Location: Right Arm)   Pulse 75   Temp 98.3 F (36.8 C) (Oral)   Resp 16   SpO2 100%   Physical Exam Vitals and nursing note reviewed.  HENT:     Head: Normocephalic and atraumatic.     Nose: Nose normal.     Mouth/Throat:     Mouth: Mucous membranes are moist.     Pharynx: Oropharynx is clear. Uvula midline. No oropharyngeal exudate or posterior oropharyngeal erythema.     Tonsils: No tonsillar exudate.  Eyes:     General: Lids are normal. Vision grossly intact.        Right eye: No discharge.        Left eye: No discharge.     Conjunctiva/sclera: Conjunctivae normal.  Neck:     Trachea: Trachea and phonation normal.  Cardiovascular:     Rate and Rhythm: Normal rate and regular rhythm.     Pulses: Normal pulses.  Pulmonary:     Effort: Pulmonary effort is normal. No tachypnea or respiratory distress.  Abdominal:     General: Bowel sounds are normal. There is no distension.     Palpations: Abdomen is soft.     Tenderness: There is no abdominal tenderness. There is no guarding or rebound.  Musculoskeletal:        General: No deformity.     Cervical back: Normal and neck supple. No spasms, tenderness or bony tenderness.     Thoracic back: Normal. No spasms, tenderness or bony tenderness.     Lumbar back: Spasms and tenderness present. No lacerations or bony tenderness.       Back:     Right hip: Tenderness present. No deformity, bony tenderness or crepitus. Normal range of motion. Normal strength.     Left hip: Normal.     Right upper leg: Normal.     Left upper leg: Normal.     Right knee: Normal.     Left knee: Normal.     Right lower leg: Normal. No edema.     Left lower leg: Normal. No edema.     Right ankle: Normal.     Right Achilles Tendon: Normal.     Left ankle: Normal.     Left Achilles Tendon: Normal.     Right foot: Normal.     Left foot: Normal.       Legs:      Comments: Pain elicited in the right  anterior hip and groin with active range of motion of the right hip.  Full passive range of motion over the right hip with pain elicited only with external rotation of the right hip. Patient is able to raise her right leg up off of the bed, there is pain elicited. 5/5 strength in plantar dorsiflexion bilaterally.  Skin:    General: Skin is warm and dry.     Capillary Refill: Capillary refill takes less than 2 seconds.  Neurological:     General: No focal deficit present.     Mental Status: She is alert and oriented to person, place, and time. Mental status is at baseline.     Sensory: Sensation is intact.     Motor: Motor function is intact.     Gait: Gait is intact.  Psychiatric:        Mood and Affect: Mood normal.     ED Results / Procedures / Treatments   Labs (all labs ordered are listed, but only abnormal results are displayed) Labs Reviewed  PREGNANCY, URINE    EKG None  Radiology DG Sacrum/Coccyx  Result Date: 08/22/2020 CLINICAL DATA:  Coccygeal pain since the patient gave birth 10 months ago. EXAM: SACRUM AND COCCYX - 2+ VIEW COMPARISON:  None. FINDINGS: There is no evidence of fracture or other focal bone lesions. IMPRESSION: Normal exam. Electronically Signed   By: Drusilla Kanner M.D.   On: 08/22/2020 12:25    Procedures Procedures   Medications Ordered in ED Medications - No data to display  ED Course  I have reviewed the triage vital signs and the nursing notes.  Pertinent labs & imaging results that were available during my care of the patient were reviewed by me and considered in my medical decision making (see chart for details).    MDM Rules/Calculators/A&P                         25 year old female presents with concern for 1 day of right hip/groin pain with movement.  No history of trauma or new activities.  Hypertensive on intake, vital signs otherwise normal.  Cardiopulmonary exam is normal, abdominal exam  is benign.  Musculoskeletal exam revealed atraumatic hip without bony tenderness to palpation or crepitus.  There is pain elicited with passive external rotation of the right hip, Full passive ROM of the right hip without pain.  Pain elicited in the right groin/anterior hip with active range of motion.  Additionally there is mild tenderness palpation of the sacrum/coccyx. No concern for inguinal hernia based on physical exam.   Plain film of the sacrum/coccyx negative for fracture or dislocation.  Given reassuring physical exam, vital signs, imaging studies, no further work-up is warranted mediastinum.  Suspect patient's right anterior hip/groin pain is secondary to acute strain of the abductor muscles of the right hip.  She may utilize OTC analgesia, apply heat/ice, and topical pain relief as needed.  Will provide brief rehab instructions, as well as contact information for [facility], this patient does not have health insurance.  Madisson voiced understanding of her medical evaluation and treatment plan.  Each of her questions was answered to her expressed satisfaction. Return precautions given.  Patient is well-appearing, stable, appropriate for discharge at this time.  This chart was dictated using voice recognition software, Dragon. Despite the best efforts of this provider to proofread and correct errors, errors may still occur which can change documentation meaning.  Final Clinical  Impression(s) / ED Diagnoses Final diagnoses:  Strain of hip adductor muscle, right, initial encounter    Rx / DC Orders ED Discharge Orders    None       Sherrilee Gilles 08/22/20 1243    Benjiman Core, MD 08/23/20 1526

## 2020-08-22 NOTE — Discharge Instructions (Addendum)
You were seen in the emergency department today for your right hip pain.  Your physical exam and vital signs are very reassuring. Additionally your xray did not reveal andy broken or dislocated bones.   The muscles in your right buttock and hip are in what is called spasm, meaning they are inappropriately tightened up.  This can be quite painful.  To help with your pain you may take Tylenol and / or NSAID medication (such as ibuprofen or naproxen) to help with your pain.   You may also utilize topical pain relief such as Biofreeze, IcyHot, or topical lidocaine patches.  I also recommend that you apply heat to the area, such as a hot shower or heating pad, and follow heat application with massage of the muscles that are most tight.  Please return to the emergency department if you develop any numbness/tingling/weakness in your arms or legs, any difficulty urinating, or urinary incontinence chest pain, shortness of breath, abdominal pain, nausea or vomiting that does not stop, or any other new severe symptoms.

## 2021-02-08 ENCOUNTER — Ambulatory Visit
Admission: RE | Admit: 2021-02-08 | Discharge: 2021-02-08 | Disposition: A | Discharge status: Home / Self Care | Payer: Medicaid Other | Source: Ambulatory Visit | Attending: Emergency Medicine | Admitting: Emergency Medicine

## 2021-02-08 ENCOUNTER — Other Ambulatory Visit: Payer: Self-pay

## 2021-02-08 VITALS — Temp 98.1°F

## 2021-02-08 DIAGNOSIS — R5383 Other fatigue: Secondary | ICD-10-CM

## 2021-02-08 NOTE — Discharge Instructions (Addendum)
EKG normal Blood work pending to screen electrolytes, hemoglobin for anemia, liver function, kidney function, thyroid and vitamin D Please rest and drink plenty of fluids Follow-up with primary care-contact below Please go to emergency room if symptoms progressing or worsening

## 2021-02-08 NOTE — ED Triage Notes (Signed)
Pt c/o dizzy, hot flashes, fatigue and pain in left upper arm (numbness, tingling, coolness last night). Patient states arm feels better. States last week she had chest pain but none at this time, denies headache at this time.  Started: last night (arm pain)

## 2021-02-08 NOTE — ED Provider Notes (Signed)
[facility]    CSN: [id number] Arrival date & time: 02/08/21  1038      History   Chief Complaint Chief Complaint  Patient presents with   Dizziness    HPI Amber Daniel is a 25 y.o. female presenting today for evaluation of fatigue, lightheadedness and chest discomfort.  Patient reports that in general over the past 2 weeks she has felt more fatigued and lightheaded than normal.  Last night she had an episode where she felt discomfort in her chest and numbness tingling/cool sensation into her left arm.  Denies any weakness.  Symptoms were intermittent for approximately 1 hour.  Took some Tylenol.  Denies history of similar.  Symptoms have eased off, but continues to feel the fatigue and lightheadedness as well as a slight heaviness on her chest.  She denies any leg pain or swelling.  Denies history of prior DVT.  Reports history of gestational hypertension, but otherwise blood pressure has been normal.  Denies tobacco use.  HPI  Past Medical History:  Diagnosis Date   Anemia    Depression    major in high school   Diabetes mellitus without complication (HCC)    Gestational diabetes    with #3   Headache    Pre-eclampsia    Pregnancy induced hypertension    #4   Vaginal Pap smear, abnormal    mod/severe cervical dysplasia, ok since    Patient Active Problem List   Diagnosis Date Noted   Encounter for induction of labor 10/24/2019   Gestational hypertension 10/24/2019   SVD (spontaneous vaginal delivery) 04/24/2018   Postpartum care following vaginal delivery (5/6) 04/24/2018    Past Surgical History:  Procedure Laterality Date   NO PAST SURGERIES      OB History     Gravida  4   Para  4   Term  3   Preterm  0   AB  0   Living  4      SAB  0   IAB  0   Ectopic  0   Multiple  0   Live Births  4            Home Medications    Prior to Admission medications   Medication Sig Start Date End Date Taking? Authorizing Provider   acetaminophen (TYLENOL) 325 MG tablet Take 650 mg by mouth every 6 (six) hours as needed for mild pain or headache.    [provider]  enalapril (VASOTEC) 10 MG tablet Take 1 tablet (10 mg total) by mouth daily. 11/07/19 12/07/19  Currie Paris, NP  ferrous sulfate 325 (65 FE) MG tablet Take 650 mg by mouth daily with breakfast.     [provider]  ibuprofen (ADVIL) 600 MG tablet Take 1 tablet (600 mg total) by mouth every 6 (six) hours. 10/26/19   Vick Frees, MD  Prenatal Vit-Fe Fumarate-FA (PRENATAL MULTIVITAMIN) TABS tablet Take 2 tablets by mouth daily at 12 noon.     [provider]    Family History Family History  Problem Relation Age of Onset   Diabetes Father    Hypertension Father    Hypertension Paternal Grandfather    Heart disease Paternal Grandfather    Healthy Mother     Social History Social History   Tobacco Use   Smoking status: Former    Types: Cigarettes   Smokeless tobacco: Never  Vaping Use   Vaping Use: Never used  Substance Use Topics  Alcohol use: Not Currently    Comment: not in pregnancy   Drug use: Not Currently    Types: Marijuana    Comment: not since 25 years old     Allergies   Patient has no active allergies.   Review of Systems Review of Systems  Constitutional:  Positive for fatigue. Negative for activity change, appetite change, chills and fever.  HENT:  Negative for congestion, ear pain, rhinorrhea, sinus pressure, sore throat and trouble swallowing.   Eyes:  Negative for photophobia, pain, discharge, redness and visual disturbance.  Respiratory:  Negative for cough, chest tightness and shortness of breath.   Cardiovascular:  Positive for chest pain.  Gastrointestinal:  Negative for abdominal pain, diarrhea, nausea and vomiting.  Genitourinary:  Negative for decreased urine volume and hematuria.  Musculoskeletal:  Negative for myalgias, neck pain and neck stiffness.  Skin:  Negative for rash.   Neurological:  Positive for dizziness, light-headedness and headaches. Negative for syncope, facial asymmetry, speech difficulty, weakness and numbness.    Physical Exam Triage Vital Signs ED Triage Vitals  Enc Vitals Group     BP --      Pulse --      Resp --      Temp 02/08/21 1118 98.1 F (36.7 C)     Temp src --      SpO2 02/08/21 1118 98 %     Weight --      Height --      Head Circumference --      Peak Flow --      Pain Score 02/08/21 1112 0     Pain Loc --      Pain Edu? --      Excl. in GC? --    Orthostatic VS for the past 24 hrs:  BP- Lying Pulse- Lying BP- Sitting Pulse- Sitting BP- Standing at 0 minutes Pulse- Standing at 0 minutes  02/08/21 1114 108/73 88 116/77 90 110/77 97    Updated Vital Signs Temp 98.1 F (36.7 C)   LMP 01/26/2021 (Approximate)   SpO2 98%   Visual Acuity Right Eye Distance:   Left Eye Distance:   Bilateral Distance:    Right Eye Near:   Left Eye Near:    Bilateral Near:     Physical Exam Vitals and nursing note reviewed.  Constitutional:      Appearance: She is well-developed.     Comments: No acute distress  HENT:     Head: Normocephalic and atraumatic.     Ears:     Comments: Bilateral ears without tenderness to palpation of external auricle, tragus and mastoid, EAC's without erythema or swelling, TM's with good bony landmarks and cone of light. Non erythematous.      Nose: Nose normal.     Mouth/Throat:     Comments: Oral mucosa pink and moist, no tonsillar enlargement or exudate. Posterior pharynx patent and nonerythematous, no uvula deviation or swelling. Normal phonation.  Eyes:     Conjunctiva/sclera: Conjunctivae normal.  Cardiovascular:     Rate and Rhythm: Normal rate.  Pulmonary:     Effort: Pulmonary effort is normal. No respiratory distress.     Comments: Breathing comfortably at rest, CTABL, no wheezing, rales or other adventitious sounds auscultated  Abdominal:     General: There is no distension.   Musculoskeletal:        General: Normal range of motion.     Cervical back: Neck supple.     Comments: Bilateral  lower legs without swelling  Moving all extremities appropriately, no reproducible chest tenderness  Skin:    General: Skin is warm and dry.  Neurological:     General: No focal deficit present.     Mental Status: She is alert and oriented to person, place, and time. Mental status is at baseline.     Cranial Nerves: No cranial nerve deficit.     Motor: No weakness.     Gait: Gait normal.     UC Treatments / Results  Labs (all labs ordered are listed, but only abnormal results are displayed) Labs Reviewed  CBC WITH DIFFERENTIAL/PLATELET  COMPREHENSIVE METABOLIC PANEL  TSH  VITAMIN D 25 HYDROXY (VIT D DEFICIENCY, FRACTURES)    EKG   Radiology No results found.  Procedures Procedures (including critical care time)  Medications Ordered in UC Medications - No data to display  Initial Impression / Assessment and Plan / UC Course  I have reviewed the triage vital signs and the nursing notes.  Pertinent labs & imaging results that were available during my care of the patient were reviewed by me and considered in my medical decision making (see chart for details).    EKG normal sinus rhythm, no acute signs of ischemia or infarction, checking basic labs to check electrolytes, LFTs-reports history of liver masses, TSH and vitamin D.  Lower suspicion of cardiac etiology.  Discussed warning signs to follow-up in emergency room for.  Lower suspicion of DVT/PE, and risk factors patient had COVID 1 month ago.  Discussed strict return precautions. Patient verbalized understanding and is agreeable with plan.  Final Clinical Impressions(s) / UC Diagnoses   Final diagnoses:  Fatigue, unspecified type     Discharge Instructions      EKG normal Blood work pending to screen electrolytes, hemoglobin for anemia, liver function, kidney function, thyroid and vitamin  D Please rest and drink plenty of fluids Follow-up with primary care-contact below Please go to emergency room if symptoms progressing or worsening   ED Prescriptions   None    PDMP not reviewed this encounter.   Lew Dawes, [place] 02/08/21 1527

## 2021-02-09 ENCOUNTER — Telehealth (HOSPITAL_COMMUNITY): Payer: Self-pay | Admitting: Emergency Medicine

## 2021-02-09 LAB — COMPREHENSIVE METABOLIC PANEL
ALT: 25 IU/L (ref 0–32)
AST: 29 IU/L (ref 0–40)
Albumin/Globulin Ratio: 2 (ref 1.2–2.2)
Albumin: 5 g/dL (ref 3.9–5.0)
Alkaline Phosphatase: 89 IU/L (ref 44–121)
BUN/Creatinine Ratio: 31 — ABNORMAL HIGH (ref 9–23)
BUN: 19 mg/dL (ref 6–20)
Bilirubin Total: 0.3 mg/dL (ref 0.0–1.2)
CO2: 18 mmol/L — ABNORMAL LOW (ref 20–29)
Calcium: 9.9 mg/dL (ref 8.7–10.2)
Chloride: 103 mmol/L (ref 96–106)
Creatinine, Ser: 0.62 mg/dL (ref 0.57–1.00)
Globulin, Total: 2.5 g/dL (ref 1.5–4.5)
Glucose: 95 mg/dL (ref 65–99)
Potassium: 4.9 mmol/L (ref 3.5–5.2)
Sodium: 137 mmol/L (ref 134–144)
Total Protein: 7.5 g/dL (ref 6.0–8.5)
eGFR: 125 mL/min/{1.73_m2} (ref >59)

## 2021-02-09 LAB — CBC WITH DIFFERENTIAL/PLATELET
Basophils Absolute: 0 10*3/uL (ref 0.0–0.2)
Basos: 1 %
EOS (ABSOLUTE): 0.1 10*3/uL (ref 0.0–0.4)
Eos: 2 %
Hematocrit: 39.5 % (ref 34.0–46.6)
Hemoglobin: 12.7 g/dL (ref 11.1–15.9)
Immature Grans (Abs): 0 10*3/uL (ref 0.0–0.1)
Immature Granulocytes: 0 %
Lymphocytes Absolute: 2 10*3/uL (ref 0.7–3.1)
Lymphs: 32 %
MCH: 27.4 pg (ref 26.6–33.0)
MCHC: 32.2 g/dL (ref 31.5–35.7)
MCV: 85 fL (ref 79–97)
Monocytes Absolute: 0.4 10*3/uL (ref 0.1–0.9)
Monocytes: 6 %
Neutrophils Absolute: 3.6 10*3/uL (ref 1.4–7.0)
Neutrophils: 59 %
Platelets: 377 10*3/uL (ref 150–450)
RBC: 4.63 x10E6/uL (ref 3.77–5.28)
RDW: 13.1 % (ref 11.7–15.4)
WBC: 6.1 10*3/uL (ref 3.4–10.8)

## 2021-02-09 LAB — TSH: TSH: 1.64 u[IU]/mL (ref 0.450–4.500)

## 2021-02-09 LAB — VITAMIN D 25 HYDROXY (VIT D DEFICIENCY, FRACTURES): Vit D, 25-Hydroxy: 12.2 ng/mL — ABNORMAL LOW (ref 30.0–100.0)

## 2021-02-09 MED ORDER — VITAMIN D (ERGOCALCIFEROL) 1.25 MG (50000 UNIT) PO CAPS: 50000.0000 [IU] | ORAL_CAPSULE | ORAL | 0 refills | Status: AC

## 2021-02-13 ENCOUNTER — Encounter (HOSPITAL_BASED_OUTPATIENT_CLINIC_OR_DEPARTMENT_OTHER): Payer: Self-pay | Admitting: *Deleted

## 2021-02-13 ENCOUNTER — Other Ambulatory Visit: Payer: Self-pay

## 2021-02-13 DIAGNOSIS — E119 Type 2 diabetes mellitus without complications: Secondary | ICD-10-CM | POA: Insufficient documentation

## 2021-02-13 DIAGNOSIS — R109 Unspecified abdominal pain: Secondary | ICD-10-CM | POA: Insufficient documentation

## 2021-02-13 DIAGNOSIS — Z87891 Personal history of nicotine dependence: Secondary | ICD-10-CM | POA: Insufficient documentation

## 2021-02-13 LAB — URINALYSIS, ROUTINE W REFLEX MICROSCOPIC
Bilirubin Urine: NEGATIVE
Glucose, UA: NEGATIVE mg/dL
Hgb urine dipstick: NEGATIVE
Ketones, ur: NEGATIVE mg/dL
Nitrite: POSITIVE — AB
Protein, ur: NEGATIVE mg/dL
Specific Gravity, Urine: 1.03 (ref 1.005–1.030)
pH: 5 (ref 5.0–8.0)

## 2021-02-13 LAB — URINALYSIS, MICROSCOPIC (REFLEX)

## 2021-02-13 LAB — PREGNANCY, URINE: Preg Test, Ur: NEGATIVE

## 2021-02-13 NOTE — ED Triage Notes (Addendum)
Pt reports sudden onset right flank pain this afternoon. States pain has resolved at this time. Reports hx of frequent UTI. States she has had frequency, dysuria

## 2021-02-14 ENCOUNTER — Emergency Department (HOSPITAL_BASED_OUTPATIENT_CLINIC_OR_DEPARTMENT_OTHER)
Admission: EM | Admit: 2021-02-14 | Discharge: 2021-02-14 | Disposition: A | Discharge status: Home / Self Care | Payer: Medicaid Other | Attending: Emergency Medicine | Admitting: Emergency Medicine

## 2021-02-14 DIAGNOSIS — R109 Unspecified abdominal pain: Secondary | ICD-10-CM

## 2021-02-14 NOTE — ED Notes (Signed)
Pt given community resources at discharge

## 2021-02-14 NOTE — ED Provider Notes (Signed)
[facility] Provider Note: Amber Spurling, MD, FACEP  CSN: [id number] MRN: [id number] ARRIVAL: 02/13/21 at [place]: [place]  Flank Pain   HISTORY OF PRESENT ILLNESS  02/14/21 2:16 AM Amber Daniel is a 25 y.o. female who had the sudden onset of right flank pain about 6:30 PM yesterday.  She states the pain was severe and worse with movement or palpation of her flank.  She had no associated fever, chills, dysuria or hematuria.  The pain lasted about an hour and then resolved on its own.  She was recently seen in the [facility] health urgent care for chronic fatigue, intermittent nausea and generalized weakness.  Work-up showed anemia which is chronic otherwise her CBC and c-Met were otherwise normal.  Her TSH was normal.  Her vitamin D level was low.  She has not yet filled her vitamin D prescription.   Past Medical History:  Diagnosis Date   Anemia    Depression    major in high school   Diabetes mellitus without complication ([place])    Gestational diabetes    with #3   Headache    Pre-eclampsia    Pregnancy induced hypertension    #4   Vaginal Pap smear, abnormal    mod/severe cervical dysplasia, ok since    Past Surgical History:  Procedure Laterality Date   NO PAST SURGERIES      Family History  Problem Relation Age of Onset   Diabetes Father    Hypertension Father    Hypertension Paternal Grandfather    Heart disease Paternal Grandfather    Healthy Mother     Social History   Tobacco Use   Smoking status: Former    Types: Cigarettes   Smokeless tobacco: Never  Vaping Use   Vaping Use: Never used  Substance Use Topics   Alcohol use: Not Currently    Comment: not in pregnancy   Drug use: Not Currently    Types: Marijuana    Comment: not since 25 years old    Prior to Admission medications   Medication Sig Start Date End Date Taking? Authorizing Provider  enalapril (VASOTEC) 10 MG tablet Take 1 tablet (10 mg total) by  mouth daily. 11/07/19 12/07/19  Virginia Rochester, NP  ferrous sulfate 325 (65 FE) MG tablet Take 650 mg by mouth daily with breakfast.     [provider]  Prenatal Vit-Fe Fumarate-FA (PRENATAL MULTIVITAMIN) TABS tablet Take 2 tablets by mouth daily at 12 noon.     [provider]  Vitamin D, Ergocalciferol, (DRISDOL) 1.25 MG (50000 UNIT) CAPS capsule Take 1 capsule (50,000 Units total) by mouth every 7 (seven) days. 02/09/21   Lamptey, Myrene Galas, MD    Allergies Patient has no known allergies.   REVIEW OF SYSTEMS  Negative except as noted here or in the History of Present Illness.   PHYSICAL EXAMINATION  Initial Vital Signs Blood pressure 121/85, pulse 63, temperature 98.5 F (36.9 C), temperature source Oral, resp. rate 16, height _0  (1.6 m), last menstrual period 01/26/2021, SpO2 100 %, not currently breastfeeding.  Examination General: Well-developed, well-nourished female in no acute distress; appearance consistent with age of record HENT: normocephalic; atraumatic Eyes: pupils equal, round and reactive to light; extraocular muscles intact Neck: supple Heart: regular rate and rhythm Lungs: clear to auscultation bilaterally Abdomen: soft; nondistended; nontender; bowel sounds present GU: No CVA tenderness Extremities: No deformity; full range of motion; pulses normal Neurologic: Awake, alert and  oriented; motor function intact in all extremities and symmetric; no facial droop Skin: Warm and dry Psychiatric: Normal mood and affect   RESULTS  Summary of this visit's results, reviewed and interpreted by myself:   EKG Interpretation  Date/Time:    Ventricular Rate:    PR Interval:    QRS Duration:   QT Interval:    QTC Calculation:   R Axis:     Text Interpretation:         Laboratory Studies: Results for orders placed or performed during the hospital encounter of 02/14/21 (from the past 24 hour(s))  Urinalysis, Routine w reflex microscopic  Urine, Clean Catch     Status: Abnormal   Collection Time: 02/13/21 11:05 PM  Result Value Ref Range   Color, Urine YELLOW YELLOW   APPearance HAZY (A) CLEAR   Specific Gravity, Urine 1.030 1.005 - 1.030   pH 5.0 5.0 - 8.0   Glucose, UA NEGATIVE NEGATIVE mg/dL   Hgb urine dipstick NEGATIVE NEGATIVE   Bilirubin Urine NEGATIVE NEGATIVE   Ketones, ur NEGATIVE NEGATIVE mg/dL   Protein, ur NEGATIVE NEGATIVE mg/dL   Nitrite POSITIVE (A) NEGATIVE   Leukocytes,Ua SMALL (A) NEGATIVE  Pregnancy, urine     Status: None   Collection Time: 02/13/21 11:05 PM  Result Value Ref Range   Preg Test, Ur NEGATIVE NEGATIVE  Urinalysis, Microscopic (reflex)     Status: Abnormal   Collection Time: 02/13/21 11:05 PM  Result Value Ref Range   RBC / HPF 0-5 0 - 5 RBC/hpf   WBC, UA 0-5 0 - 5 WBC/hpf   Bacteria, UA MANY (A) NONE SEEN   Squamous Epithelial / LPF 0-5 0 - 5   Imaging Studies: No results found.  ED COURSE and MDM  Nursing notes, initial and subsequent vitals signs, including pulse oximetry, reviewed and interpreted by myself.  Vitals:   02/13/21 2256 02/13/21 2302 02/14/21 0125  BP:  (!) 131/93 121/85  Pulse:  84 63  Resp:  18 16  Temp:  98.4 F (36.9 C) 98.5 F (36.9 C)  TempSrc:  Oral Oral  SpO2:  100% 100%  Height: _0  (1.6 m)     Medications - No data to display  The cause of her flank pain is unclear.  She could have passed a kidney stone but that pain is not typically worse with movement or palpation.  Her urinalysis is not consistent with a urinary tract infection.  She could have had a muscle spasm that resolved.  She was advised that she does need a PCP to manage her more chronic issues and that she should definitely start taking her vitamin D.  PROCEDURES  Procedures   ED DIAGNOSES     ICD-10-CM   1. Right flank pain  R10.9          Brannon Decaire, Jenny Reichmann, MD 02/14/21 [phone]

## 2021-02-16 LAB — URINE CULTURE: Culture: >=100000 — AB

## 2021-02-17 ENCOUNTER — Telehealth: Payer: Self-pay | Admitting: *Deleted

## 2021-02-17 NOTE — Progress Notes (Signed)
ED Antimicrobial Stewardship Positive Culture Follow Up   Amber Daniel is an 25 y.o. female who presented to [facility] on 02/14/2021 with a chief complaint of  Chief Complaint  Patient presents with   Flank Pain  Denies fever, chills, dysuria, and hematuria. Flank pain resolved on its own in ~1 hour.   Recent Results (from the past 720 hour(s))  Urine Culture     Status: Abnormal   Collection Time: 02/13/21 11:05 PM   Specimen: Urine, Clean Catch  Result Value Ref Range Status   Specimen Description   Final    URINE, CLEAN CATCH Performed at [facility], [address] [place], [place] [address]    Special Requests   Final    NONE Performed at [facility], [address] [place], [place] [address]    Culture >=100,000 COLONIES/mL ESCHERICHIA COLI (A)  Final   Report Status 02/16/2021 FINAL  Final   Organism ID, Bacteria ESCHERICHIA COLI (A)  Final      Susceptibility   Escherichia coli - MIC*    AMPICILLIN 8 SENSITIVE Sensitive     CEFAZOLIN <=4 SENSITIVE Sensitive     CEFEPIME <=0.12 SENSITIVE Sensitive     CEFTRIAXONE <=0.25 SENSITIVE Sensitive     CIPROFLOXACIN <=0.25 SENSITIVE Sensitive     GENTAMICIN <=1 SENSITIVE Sensitive     IMIPENEM <=0.25 SENSITIVE Sensitive     NITROFURANTOIN <=16 SENSITIVE Sensitive     TRIMETH/SULFA <=20 SENSITIVE Sensitive     AMPICILLIN/SULBACTAM 4 SENSITIVE Sensitive     PIP/TAZO <=4 SENSITIVE Sensitive     * >=100,000 COLONIES/mL ESCHERICHIA COLI    [x]  Patient discharged originally without antimicrobial agent and treatment is now indicated  Plan:  Call patient and conduct a symptom check for urinary symptoms If patient is asymptomatic: no antibiotics indicated If patient has urinary symptoms: Keflex 500mg  BID x5 days  ED Provider: , PA-C   , PharmD Pharmacy Resident 02/17/2021, 9:36 AM  Monday - Friday phone -  [phone] Saturday - Sunday phone - [phone]

## 2021-02-17 NOTE — Telephone Encounter (Signed)
Post ED Visit - Positive Culture Follow-up  Culture report reviewed by antimicrobial stewardship pharmacist: [facility] []  , Pharm.D. []  Enzo Bi, Pharm.D., BCPS AQ-ID []  , Pharm.D., BCPS []  Celedonio Miyamoto, Pharm.D., BCPS []  [place], Garvin Fila.D., BCPS, AAHIVP []  , Pharm.D., BCPS, AAHIVP []  Georgina Pillion, PharmD, BCPS []  , PharmD, BCPS []  [place], PharmD, BCPS []  [place], PharmD []  , PharmD, BCPS []  Estella Husk, PharmD  Pharmacy Team []  Lysle Pearl, PharmD []  , PharmD []  Phillips Climes, PharmD []  , Rph []  Agapito Games) , PharmD []  Verlan Friends, PharmD []  , PharmD []  Mervyn Gay, PharmD []  , PharmD []  Vinnie Level, PharmD []  [facility], PharmD []  , PharmD []  Len Childs, PharmD   Positive urine culture Denies UTI symptoms, declined any antibiotic treatment and no further patient follow-up is required at this time.  [facility] 02/17/2021, 10:13 AM

## 2022-08-24 IMAGING — DX DG SACRUM/COCCYX 2+V
3 series · 3 of 3 positions shown · non-contrast
Comparison: None.

CLINICAL DATA: Coccygeal pain since the patient gave birth 10
months ago.

EXAM:
SACRUM AND COCCYX - 2+ VIEW

[coccyx ap]
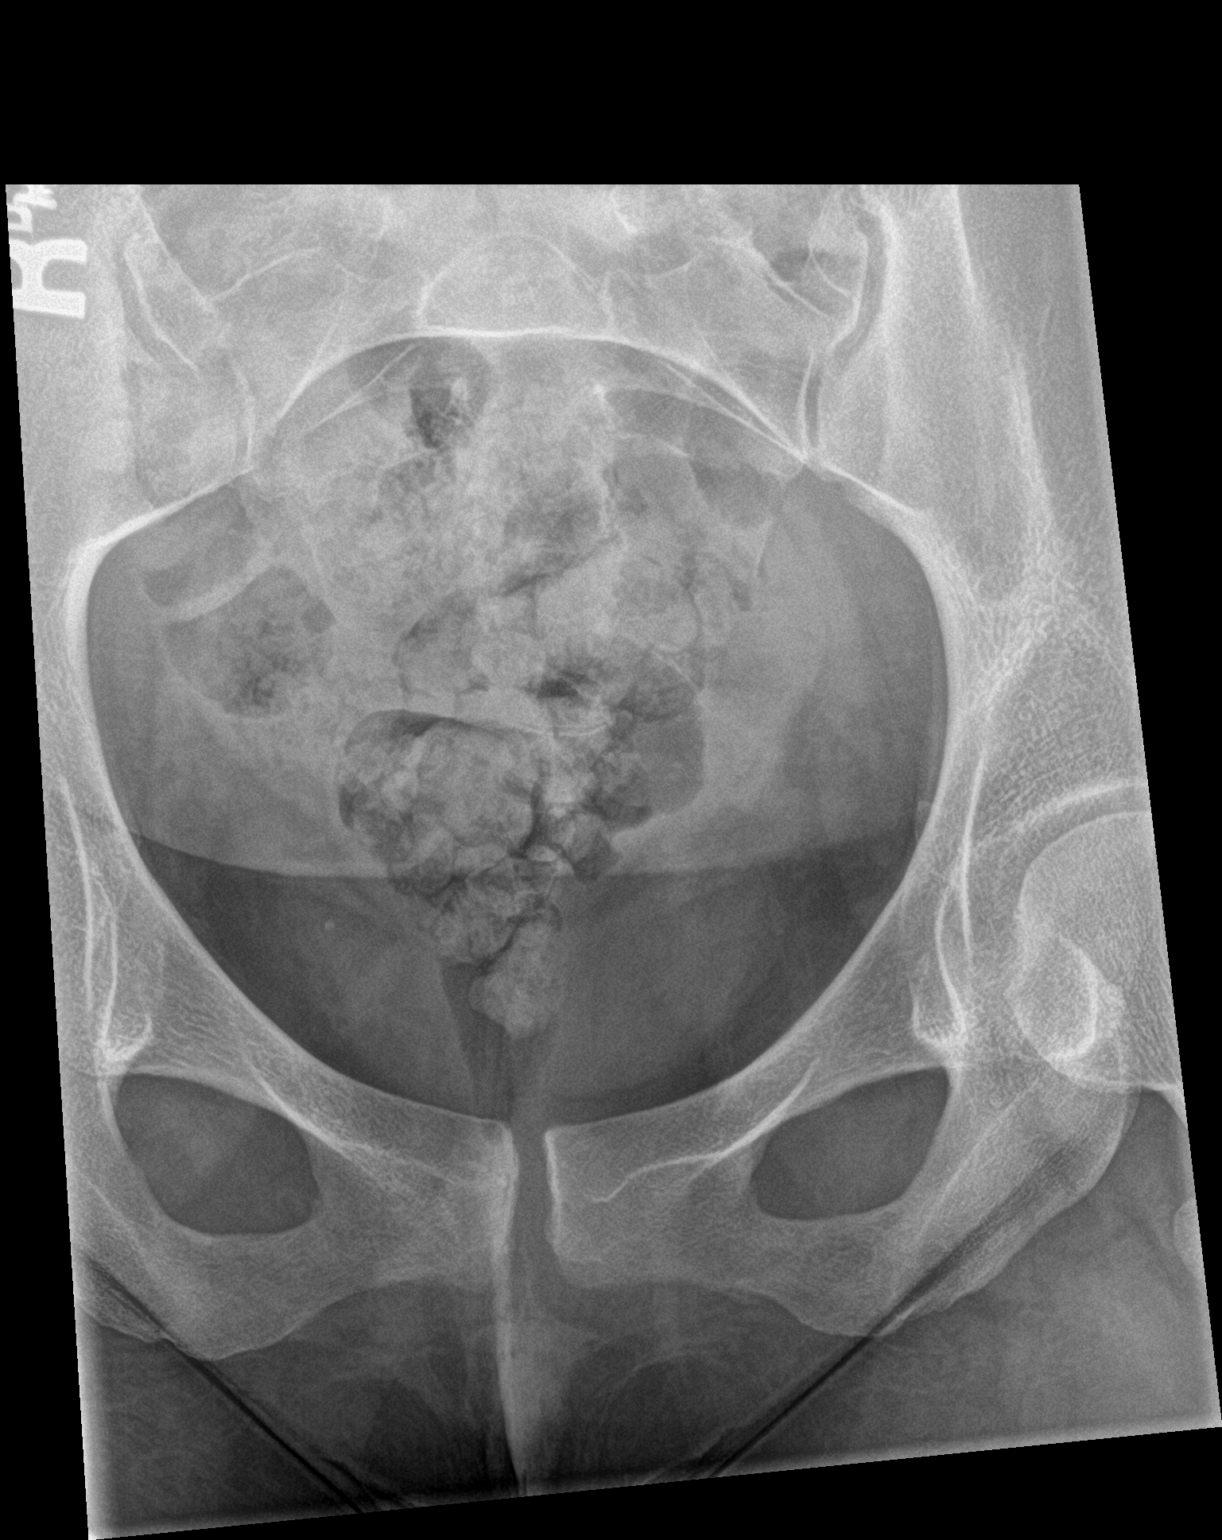

[sacrum ap]
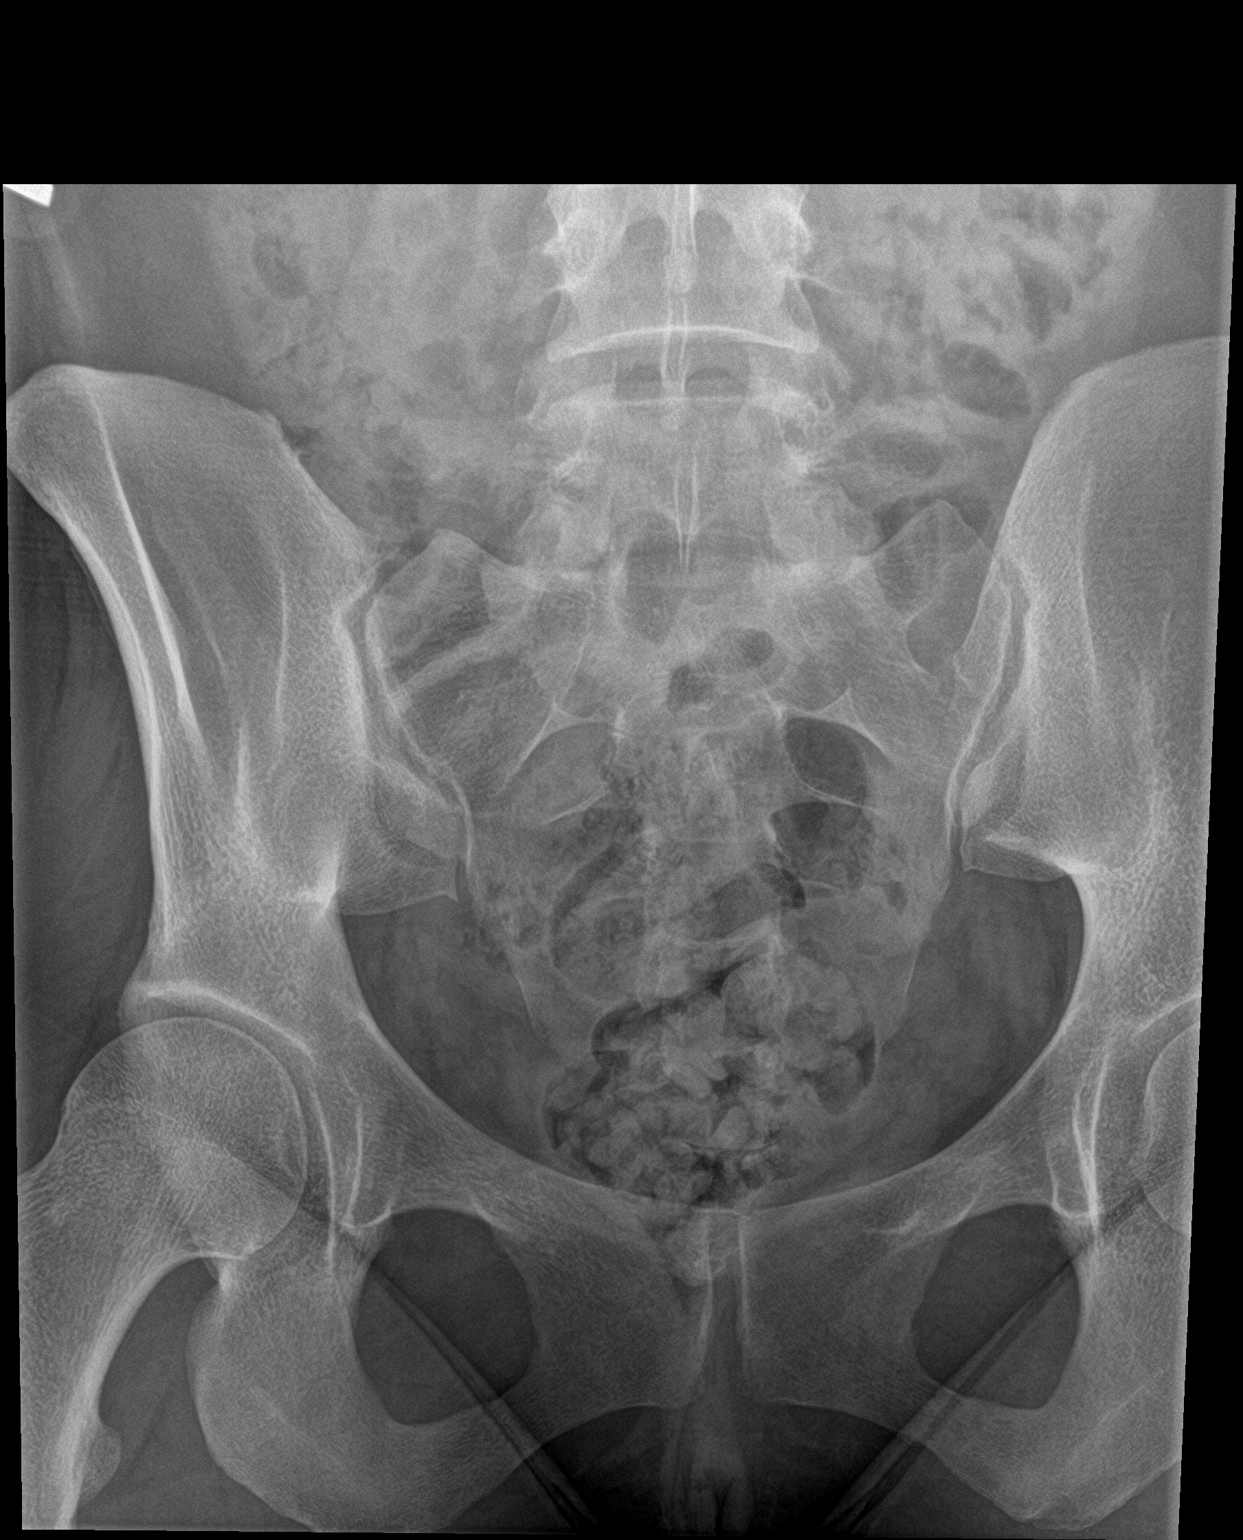

[sacrum lat]
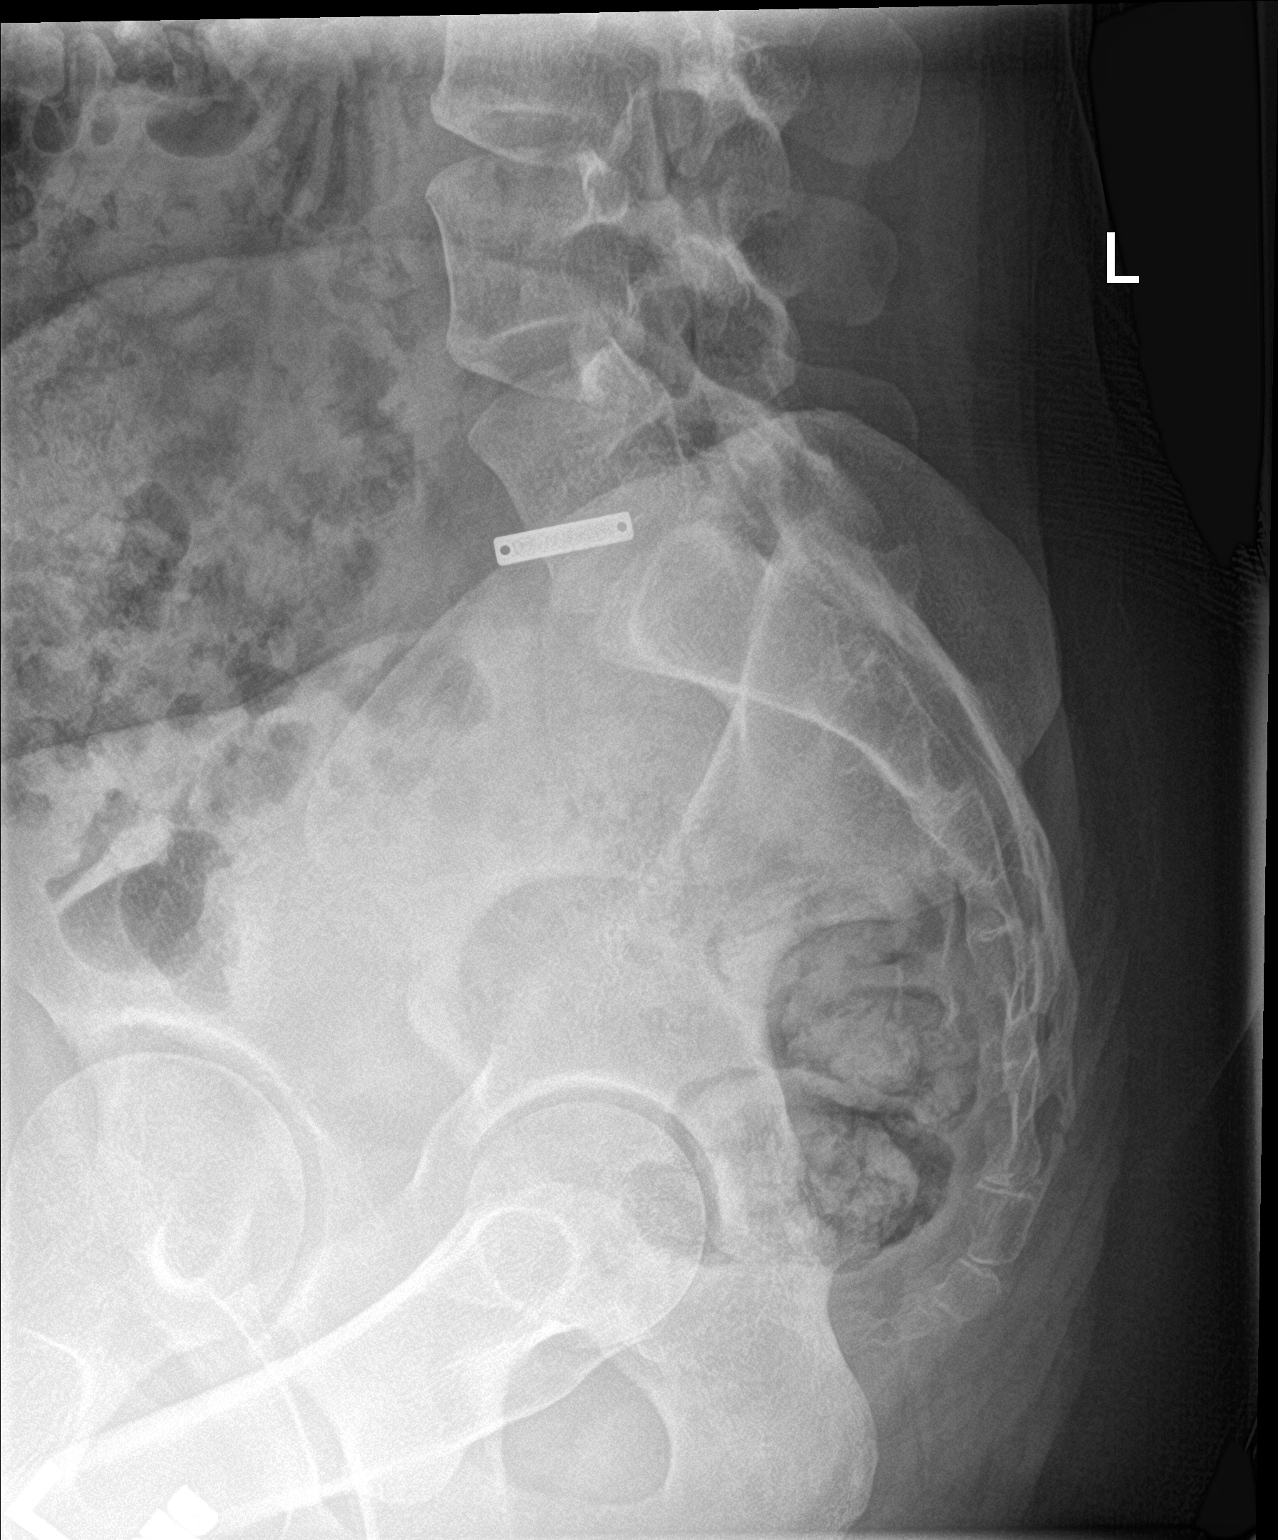

[3 of 3 positions shown; findings below may reference images not displayed]

FINDINGS: There is no evidence of fracture or other focal bone lesions.
IMPRESSION: Normal exam.

## 2023-08-15 ENCOUNTER — Ambulatory Visit
Admission: RE | Admit: 2023-08-15 | Discharge: 2023-08-15 | Disposition: A | Discharge status: Home / Self Care | Payer: Medicaid Other | Source: Ambulatory Visit | Attending: Family Medicine | Admitting: Family Medicine

## 2023-08-15 VITALS — BP 119/85 | HR 84 | Temp 98.2°F | Resp 16

## 2023-08-15 DIAGNOSIS — J019 Acute sinusitis, unspecified: Secondary | ICD-10-CM | POA: Diagnosis not present

## 2023-08-15 DIAGNOSIS — B9689 Other specified bacterial agents as the cause of diseases classified elsewhere: Secondary | ICD-10-CM

## 2023-08-15 MED ORDER — CETIRIZINE HCL 10 MG PO TABS: 10.0000 mg | ORAL_TABLET | Freq: Every day | ORAL | 0 refills | Status: AC

## 2023-08-15 MED ORDER — PSEUDOEPHEDRINE HCL 60 MG PO TABS: 60.0000 mg | ORAL_TABLET | Freq: Three times a day (TID) | ORAL | 0 refills | Status: DC | PRN

## 2023-08-15 MED ORDER — AMOXICILLIN 875 MG PO TABS: 875.0000 mg | ORAL_TABLET | Freq: Two times a day (BID) | ORAL | 0 refills | Status: AC

## 2023-08-15 MED ORDER — PROMETHAZINE-DM 6.25-15 MG/5ML PO SYRP: 5.0000 mL | ORAL_SOLUTION | Freq: Every evening | ORAL | 0 refills | Status: DC | PRN

## 2023-08-15 NOTE — ED Triage Notes (Signed)
 Pt c/o cough, head/chest congestion, body aches, fever x 9 days-NAD-steady gait

## 2023-08-15 NOTE — ED Provider Notes (Signed)
 [facility]  Note:  This document was prepared using Conservation officer, historic buildings and may include unintentional dictation errors.  MRN: [id number] DOB: [birth year]  Subjective:   Amber Daniel is a 28 y.o. female presenting for 1 day history of persistent fevers, sinus congestion, sinus drainage, body pains, malaise, fatigue, coughing.  No chest pain, shortness of breath or wheezing.  No asthma.  No smoking of any kind including cigarettes, cigars, vaping, marijuana use.    No current facility-administered medications for this encounter.  Current Outpatient Medications:    enalapril (VASOTEC) 10 MG tablet, Take 1 tablet (10 mg total) by mouth daily., Disp: 30 tablet, Rfl: 0   ferrous sulfate 325 (65 FE) MG tablet, Take 650 mg by mouth daily with breakfast. , Disp: , Rfl:    Prenatal Vit-Fe Fumarate-FA (PRENATAL MULTIVITAMIN) TABS tablet, Take 2 tablets by mouth daily at 12 noon. , Disp: , Rfl:    Vitamin D, Ergocalciferol, (DRISDOL) 1.25 MG (50000 UNIT) CAPS capsule, Take 1 capsule (50,000 Units total) by mouth every 7 (seven) days., Disp: 5 capsule, Rfl: 0   No Known Allergies  Past Medical History:  Diagnosis Date   Anemia    Depression    major in high school   Diabetes mellitus without complication (HCC)    Gestational diabetes    with #3   Headache    Pre-eclampsia    Pregnancy induced hypertension    #4   Vaginal Pap smear, abnormal    mod/severe cervical dysplasia, ok since     Past Surgical History:  Procedure Laterality Date   NO PAST SURGERIES      Family History  Problem Relation Age of Onset   Diabetes Father    Hypertension Father    Hypertension Paternal Grandfather    Heart disease Paternal Grandfather    Healthy Mother     Social History   Tobacco Use   Smoking status: Former    Types: Cigarettes   Smokeless tobacco: Never  Vaping Use   Vaping status: Never Used  Substance Use Topics   Alcohol use: Not  Currently   Drug use: Not Currently    ROS   Objective:   Vitals: BP 119/85 (BP Location: Left Arm)   Pulse 84   Temp 98.2 F (36.8 C) (Oral)   Resp 16   LMP 08/10/2023   SpO2 98%   Physical Exam Constitutional:      General: She is not in acute distress.    Appearance: Normal appearance. She is well-developed and normal weight. She is not ill-appearing, toxic-appearing or diaphoretic.  HENT:     Head: Normocephalic and atraumatic.     Right Ear: Tympanic membrane, ear canal and external ear normal. No drainage or tenderness. No middle ear effusion. There is no impacted cerumen. Tympanic membrane is not erythematous or bulging.     Left Ear: Tympanic membrane, ear canal and external ear normal. No drainage or tenderness.  No middle ear effusion. There is no impacted cerumen. Tympanic membrane is not erythematous or bulging.     Nose: Congestion and rhinorrhea present.     Mouth/Throat:     Mouth: Mucous membranes are moist. No oral lesions.     Pharynx: No pharyngeal swelling, oropharyngeal exudate, posterior oropharyngeal erythema or uvula swelling.     Tonsils: No tonsillar exudate or tonsillar abscesses.  Eyes:     General: No scleral icterus.       Right eye: No discharge.  Left eye: No discharge.     Extraocular Movements: Extraocular movements intact.     Right eye: Normal extraocular motion.     Left eye: Normal extraocular motion.     Conjunctiva/sclera: Conjunctivae normal.  Cardiovascular:     Rate and Rhythm: Normal rate and regular rhythm.     Heart sounds: Normal heart sounds. No murmur heard.    No friction rub. No gallop.  Pulmonary:     Effort: Pulmonary effort is normal. No respiratory distress.     Breath sounds: No stridor. No wheezing, rhonchi or rales.  Chest:     Chest wall: No tenderness.  Musculoskeletal:     Cervical back: Normal range of motion and neck supple.  Lymphadenopathy:     Cervical: No cervical adenopathy.  Skin:     General: Skin is warm and dry.  Neurological:     General: No focal deficit present.     Mental Status: She is alert and oriented to person, place, and time.  Psychiatric:        Mood and Affect: Mood normal.        Behavior: Behavior normal.     Assessment and Plan :   PDMP not reviewed this encounter.  1. Acute bacterial sinusitis    Deferred imaging given clear cardiopulmonary exam, hemodynamically stable vital signs. Will start empiric treatment for sinusitis with amoxicillin.  Recommended supportive care otherwise. Counseled patient on potential for adverse effects with medications prescribed/recommended today, ER and return-to-clinic precautions discussed, patient verbalized understanding.    Wallis Bamberg, [place] 08/15/23 1218

## 2023-08-15 NOTE — Discharge Instructions (Addendum)
We will manage this as a sinus infection with amoxicillin. For sore throat or cough try using a honey-based tea. Use 3 teaspoons of honey with juice squeezed from half lemon. Place shaved pieces of ginger into 1/2-1 cup of water and warm over stove top. Then mix the ingredients and repeat every 4 hours as needed. Please take ibuprofen 600mg every 6 hours with food alternating with OR taken together with Tylenol 500mg-650mg every 6 hours for throat pain, fevers, aches and pains. Hydrate very well with at least 2 liters of water. Eat light meals such as soups (chicken and noodles, vegetable, chicken and wild rice).  Do not eat foods that you are allergic to.  Taking an antihistamine like Zyrtec can help against postnasal drainage, sinus congestion which can cause sinus pain, sinus headaches, throat pain, painful swallowing, coughing.  You can take this together with pseudoephedrine (Sudafed) at a dose of 60 mg 3 times a day or twice daily as needed for the same kind of nasal drip, congestion.  Use cough medication as needed.   

## 2024-07-08 ENCOUNTER — Telehealth: Admitting: Physician Assistant

## 2024-07-08 DIAGNOSIS — J069 Acute upper respiratory infection, unspecified: Secondary | ICD-10-CM | POA: Diagnosis not present

## 2024-07-08 DIAGNOSIS — J040 Acute laryngitis: Secondary | ICD-10-CM | POA: Diagnosis not present

## 2024-07-08 MED ORDER — PSEUDOEPH-BROMPHEN-DM 30-2-10 MG/5ML PO SYRP: 5.0000 mL | ORAL_SOLUTION | Freq: Four times a day (QID) | ORAL | 0 refills | Status: AC | PRN

## 2024-07-08 MED ORDER — FLUTICASONE PROPIONATE 50 MCG/ACT NA SUSP: 2.0000 | Freq: Every day | NASAL | 6 refills | Status: AC

## 2024-07-08 NOTE — Progress Notes (Signed)
 " Virtual Visit Consent   Amber Daniel, you are scheduled for a virtual visit with a [facility] provider today. Just as with appointments in the office, your consent must be obtained to participate. Your consent will be active for this visit and any virtual visit you may have with one of our providers in the next 365 days. If you have a MyChart account, a copy of this consent can be sent to you electronically.  As this is a virtual visit, video technology does not allow for your provider to perform a traditional examination. This may limit your provider's ability to fully assess your condition. If your provider identifies any concerns that need to be evaluated in person or the need to arrange testing (such as labs, EKG, etc.), we will make arrangements to do so. Although advances in technology are sophisticated, we cannot ensure that it will always work on either your end or our end. If the connection with a video visit is poor, the visit may have to be switched to a telephone visit. With either a video or telephone visit, we are not always able to ensure that we have a secure connection.  By engaging in this virtual visit, you consent to the provision of healthcare and authorize for your insurance to be billed (if applicable) for the services provided during this visit. Depending on your insurance coverage, you may receive a charge related to this service.  I need to obtain your verbal consent now. Are you willing to proceed with your visit today? Amber Daniel has provided verbal consent on 07/08/2024 for a virtual visit (video or telephone). Teena Shuck, [place]  Date: 07/08/2024 9:45 AM   Virtual Visit via Video Note   I, Teena Shuck, connected with  Amber Daniel  ([id number], [birth year]) on 07/08/24 at  9:45 AM EST by a video-enabled telemedicine application and verified that I am speaking with the correct person using two identifiers.  Location: Patient: Virtual Visit Location  Patient: Home Provider: Virtual Visit Location Provider: Home Office   I discussed the limitations of evaluation and management by telemedicine and the availability of in person appointments. The patient expressed understanding and agreed to proceed.    History of Present Illness: Amber Daniel is a 29 y.o. who identifies as a female who was assigned female at birth, and is being seen today for URI.  HPI: URI  This is a new problem. The current episode started in the past 7 days. The problem has been unchanged. There has been no fever. Associated symptoms include coughing, rhinorrhea and a sore throat. She has tried nothing for the symptoms. The treatment provided no relief.    Problems:  Patient Active Problem List   Diagnosis Date Noted   Encounter for induction of labor 10/24/2019   Gestational hypertension 10/24/2019   SVD (spontaneous vaginal delivery) 04/24/2018   Postpartum care following vaginal delivery (5/6) 04/24/2018    Allergies: Allergies[1] Medications: Current Medications[2]  Observations/Objective: Patient is well-developed, well-nourished in no acute distress.  Resting comfortably  at home.  Head is normocephalic, atraumatic.  No labored breathing.  Speech is clear and coherent with logical content.  Patient is alert and oriented at baseline.    Assessment and Plan: 1. Upper respiratory tract infection, unspecified type (Primary)  2. Laryngitis  Patient presenting with URI. Differentials include allergic rhinitis, COVID,  bacterial pneumonia, sinusitis. Do not suspect underlying cardiopulmonary process. I considered, but think unlikely, dangerous causes of this patient's symptoms to include ACS, CHF or  pneumonia, pneumothorax. Patient is nontoxic and not in need of emergent medical intervention. Advised patient to complete home test for flu or COVID.  Plan: reassurance, reassessment, discharge with PCP follow-up  Follow Up Instructions: I discussed the  assessment and treatment plan with the patient. The patient was provided an opportunity to ask questions and all were answered. The patient agreed with the plan and demonstrated an understanding of the instructions.  A copy of instructions were sent to the patient via MyChart unless otherwise noted below.    The patient was advised to call back or seek an in-person evaluation if the symptoms worsen or if the condition fails to improve as anticipated.    Genine Beckett, PA-C     [1] No Known Allergies [2]  Current Outpatient Medications:    amoxicillin  (AMOXIL ) 875 MG tablet, Take 1 tablet (875 mg total) by mouth 2 (two) times daily., Disp: 14 tablet, Rfl: 0   cetirizine  (ZYRTEC  ALLERGY) 10 MG tablet, Take 1 tablet (10 mg total) by mouth daily., Disp: 30 tablet, Rfl: 0   enalapril  (VASOTEC ) 10 MG tablet, Take 1 tablet (10 mg total) by mouth daily., Disp: 30 tablet, Rfl: 0   ferrous sulfate  325 (65 FE) MG tablet, Take 650 mg by mouth daily with breakfast. , Disp: , Rfl:    Prenatal Vit-Fe Fumarate-FA (PRENATAL MULTIVITAMIN) TABS tablet, Take 2 tablets by mouth daily at 12 noon. , Disp: , Rfl:    promethazine -dextromethorphan (PROMETHAZINE -DM) 6.25-15 MG/5ML syrup, Take 5 mLs by mouth at bedtime as needed for cough., Disp: 100 mL, Rfl: 0   pseudoephedrine  (SUDAFED) 60 MG tablet, Take 1 tablet (60 mg total) by mouth every 8 (eight) hours as needed for congestion., Disp: 30 tablet, Rfl: 0   Vitamin D , Ergocalciferol , (DRISDOL ) 1.25 MG (50000 UNIT) CAPS capsule, Take 1 capsule (50,000 Units total) by mouth every 7 (seven) days., Disp: 5 capsule, Rfl: 0  "

## 2024-07-08 NOTE — Patient Instructions (Signed)
" °  Dena Hoof, thank you for joining Teena Shuck, PA-C for today's virtual visit.  While this provider is not your primary care provider (PCP), if your PCP is located in our provider database this encounter information will be shared with them immediately following your visit.   A [facility] MyChart account gives you access to today's visit and all your visits, tests, and labs performed at [facility]  click here if you don't have a [facility] MyChart account or go to [email or web address]  Consent: (Patient) Amber Daniel provided verbal consent for this virtual visit at the beginning of the encounter.  Current Medications:  Current Outpatient Medications:    amoxicillin  (AMOXIL ) 875 MG tablet, Take 1 tablet (875 mg total) by mouth 2 (two) times daily., Disp: 14 tablet, Rfl: 0   cetirizine  (ZYRTEC  ALLERGY) 10 MG tablet, Take 1 tablet (10 mg total) by mouth daily., Disp: 30 tablet, Rfl: 0   enalapril  (VASOTEC ) 10 MG tablet, Take 1 tablet (10 mg total) by mouth daily., Disp: 30 tablet, Rfl: 0   ferrous sulfate  325 (65 FE) MG tablet, Take 650 mg by mouth daily with breakfast. , Disp: , Rfl:    Prenatal Vit-Fe Fumarate-FA (PRENATAL MULTIVITAMIN) TABS tablet, Take 2 tablets by mouth daily at 12 noon. , Disp: , Rfl:    promethazine -dextromethorphan (PROMETHAZINE -DM) 6.25-15 MG/5ML syrup, Take 5 mLs by mouth at bedtime as needed for cough., Disp: 100 mL, Rfl: 0   pseudoephedrine  (SUDAFED) 60 MG tablet, Take 1 tablet (60 mg total) by mouth every 8 (eight) hours as needed for congestion., Disp: 30 tablet, Rfl: 0   Vitamin D , Ergocalciferol , (DRISDOL ) 1.25 MG (50000 UNIT) CAPS capsule, Take 1 capsule (50,000 Units total) by mouth every 7 (seven) days., Disp: 5 capsule, Rfl: 0   Medications ordered in this encounter:  No orders of the defined types were placed in this encounter.    *If you need refills on other medications prior to your next appointment, please contact your  pharmacy*  Follow-Up: Call back or seek an in-person evaluation if the symptoms worsen or if the condition fails to improve as anticipated.  [facility] [phone]  Other Instructions Follow up with primary provider in 24-48 hours. Report to nearest ER with any worsening symptoms.    If you have been instructed to have an in-person evaluation today at a local Urgent Care facility, please use the link below. It will take you to a list of all of our available [facility], including address, phone number and hours of operation. Please do not delay care.  [facility]  If you or a family member do not have a primary care provider, use the link below to schedule a visit and establish care. When you choose a [facility] primary care physician or advanced practice provider, you gain a long-term partner in health. Find a Primary Care Provider  Learn more about [facility]'s in-office and virtual care options:  - Get Care Now  "
